# Patient Record
Sex: Female | Born: 1985
Health system: Southern US, Community
[De-identification: ages and names within clinical notes are randomized; demographics above are authoritative.]

## PROBLEM LIST (undated history)

## (undated) ENCOUNTER — Inpatient Hospital Stay (HOSPITAL_COMMUNITY): Payer: Self-pay

## (undated) HISTORY — PX: WISDOM TOOTH EXTRACTION: SHX21

---

## 2012-02-25 ENCOUNTER — Encounter (HOSPITAL_COMMUNITY): Payer: Self-pay | Admitting: Family Medicine

## 2012-02-25 ENCOUNTER — Emergency Department (HOSPITAL_COMMUNITY)
Admission: EM | Admit: 2012-02-25 | Discharge: 2012-02-25 | Disposition: A | Discharge status: Home / Self Care | Payer: Self-pay | Attending: Emergency Medicine | Admitting: Emergency Medicine

## 2012-02-25 DIAGNOSIS — N390 Urinary tract infection, site not specified: Secondary | ICD-10-CM | POA: Insufficient documentation

## 2012-02-25 DIAGNOSIS — R300 Dysuria: Secondary | ICD-10-CM | POA: Insufficient documentation

## 2012-02-25 LAB — URINALYSIS, ROUTINE W REFLEX MICROSCOPIC
Protein, ur: 100 mg/dL — AB
Urobilinogen, UA: 2 mg/dL — ABNORMAL HIGH (ref 0.0–1.0)

## 2012-02-25 LAB — POCT PREGNANCY, URINE: Preg Test, Ur: NEGATIVE

## 2012-02-25 LAB — URINE MICROSCOPIC-ADD ON

## 2012-02-25 MED ORDER — SULFAMETHOXAZOLE-TMP DS 800-160 MG PO TABS
1.0000 | ORAL_TABLET | Freq: Once | ORAL | Status: AC
  Administered 2012-02-25: 1 via ORAL
  Filled 2012-02-25: qty 1

## 2012-02-25 MED ORDER — PHENAZOPYRIDINE HCL 200 MG PO TABS: 200.0000 mg | ORAL_TABLET | Freq: Three times a day (TID) | ORAL | Status: AC | PRN

## 2012-02-25 MED ORDER — SULFAMETHOXAZOLE-TMP DS 800-160 MG PO TABS: 1.0000 | ORAL_TABLET | Freq: Two times a day (BID) | ORAL | Status: AC

## 2012-02-25 MED ORDER — PHENAZOPYRIDINE HCL 200 MG PO TABS
200.0000 mg | ORAL_TABLET | Freq: Once | ORAL | Status: AC
  Administered 2012-02-25: 200 mg via ORAL
  Filled 2012-02-25: qty 1

## 2012-02-25 NOTE — ED Notes (Signed)
Patient states that she has been having pressure in her lower abdomen, lower back pain, frequent urination, and dysuria since Sunday and has gotten. Took OTC Azo today without relief.

## 2012-02-25 NOTE — ED Notes (Signed)
ALP bedside 

## 2012-02-25 NOTE — ED Provider Notes (Signed)
History     CSN: [id number]  Arrival date & time 02/25/12  0050   First MD Initiated Contact with Patient 02/25/12 0258      Chief Complaint  Patient presents with  . Cystitis    (Consider location/radiation/quality/duration/timing/severity/associated sxs/prior treatment) HPI Comments: Patient states that for the last several, weeks.  She's had some dysuria, that she's been controlling with over-the-counter a 0 and Cystex, but for the last 2 days has had increased dysuria.  Denies fever, nausea, vomiting, diarrhea, vaginal discharge  The history is provided by the patient.    History reviewed. No pertinent past medical history.  No past surgical history on file.  No family history on file.  History  Substance Use Topics  . Smoking status: Never Smoker   . Smokeless tobacco: Not on file  . Alcohol Use: 0.6 oz/week    1 Cans of beer per week    OB History    Grav Para Term Preterm Abortions TAB SAB Ect Mult Living                  Review of Systems  Allergies  Review of patient's allergies indicates no known allergies.  Home Medications   Current Outpatient Rx  Name Route Sig Dispense Refill  . BC HEADACHE POWDER PO Oral Take 1 packet by mouth daily as needed. For pain    . CYSTEX PO Oral Take 2 capsules by mouth 3 (three) times daily.    . AZO TABS PO Oral Take 2 capsules by mouth 3 (three) times daily.      BP 133/81  Pulse 103  Temp(Src) 98.8 F (37.1 C) (Oral)  Resp 18  SpO2 100%  LMP 02/06/2012  Physical Exam  Constitutional: She appears well-developed and well-nourished.  HENT:  Head: Normocephalic.  Eyes: Pupils are equal, round, and reactive to light.  Neck: Normal range of motion.  Cardiovascular: Normal rate.   Pulmonary/Chest: Effort normal.  Abdominal: Soft. There is tenderness in the suprapubic area.      ED Course  Procedures (including critical care time)  Labs Reviewed  URINALYSIS, ROUTINE W REFLEX MICROSCOPIC - Abnormal;  Notable for the following:    Color, Urine RED (*) BIOCHEMICALS MAY BE AFFECTED BY COLOR   APPearance TURBID (*)    Hgb urine dipstick TRACE (*)    Bilirubin Urine SMALL (*)    Ketones, ur TRACE (*)    Protein, ur 100 (*)    Urobilinogen, UA 2.0 (*)    Nitrite POSITIVE (*)    Leukocytes, UA LARGE (*)    All other components within normal limits  URINE MICROSCOPIC-ADD ON - Abnormal; Notable for the following:    Bacteria, UA FEW (*)    All other components within normal limits  POCT PREGNANCY, URINE   No results found.   No diagnosis found.    MDM  Urinary tract infection        Arman Filter, NP 02/25/12 [phone]

## 2012-02-25 NOTE — Discharge Instructions (Signed)
Urinary Tract Infection Infections of the urinary tract can start in several places. A bladder infection (cystitis), a kidney infection (pyelonephritis), and a prostate infection (prostatitis) are different types of urinary tract infections (UTIs). They usually get better if treated with medicines (antibiotics) that kill germs. Take all the medicine until it is gone. You or your child may feel better in a few days, but TAKE ALL MEDICINE or the infection may not respond and may become more difficult to treat. HOME CARE INSTRUCTIONS   Drink enough water and fluids to keep the urine clear or pale yellow. Cranberry juice is especially recommended, in addition to large amounts of water.   Avoid caffeine, tea, and carbonated beverages. They tend to irritate the bladder.   Alcohol may irritate the prostate.   Only take over-the-counter or prescription medicines for pain, discomfort, or fever as directed by your caregiver.  To prevent further infections:  Empty the bladder often. Avoid holding urine for long periods of time.   After a bowel movement, women should cleanse from front to back. Use each tissue only once.   Empty the bladder before and after sexual intercourse.  FINDING OUT THE RESULTS OF YOUR TEST Not all test results are available during your visit. If your or your child's test results are not back during the visit, make an appointment with your caregiver to find out the results. Do not assume everything is normal if you have not heard from your caregiver or the medical facility. It is important for you to follow up on all test results. SEEK MEDICAL CARE IF:   There is back pain.   Your baby is older than 3 months with a rectal temperature of 100.5 F (38.1 C) or higher for more than 1 day.   Your or your child's problems (symptoms) are no better in 3 days. Return sooner if you or your child is getting worse.  SEEK IMMEDIATE MEDICAL CARE IF:   There is severe back pain or lower  abdominal pain.   You or your child develops chills.   You have a fever.   Your baby is older than 3 months with a rectal temperature of 102 F (38.9 C) or higher.   Your baby is 74 months old or younger with a rectal temperature of 100.4 F (38 C) or higher.   There is nausea or vomiting.   There is continued burning or discomfort with urination.  MAKE SURE YOU:   Understand these instructions.   Will watch your condition.   Will get help right away if you are not doing well or get worse.  Document Released: 07/15/2005 Document Revised: 09/24/2011 Document Reviewed: 02/17/2007 [facility] Patient Information 2012 [place], [place]. You have been given a prescription for a medication called, Pyridium, which will make her urine orange and coat the bladder to decrease her symptoms have also been given a prescription for Septra or Bactrim DS, which is an antibiotic, please take this until completion

## 2012-02-26 NOTE — ED Provider Notes (Signed)
Medical screening examination/treatment/procedure(s) were performed by non-physician practitioner and as supervising physician I was immediately available for consultation/collaboration.  Gerhard Munch, MD 02/26/12 [phone]

## 2013-11-30 ENCOUNTER — Encounter (HOSPITAL_COMMUNITY): Payer: Self-pay | Admitting: Emergency Medicine

## 2013-11-30 ENCOUNTER — Emergency Department (INDEPENDENT_AMBULATORY_CARE_PROVIDER_SITE_OTHER)
Admission: EM | Admit: 2013-11-30 | Discharge: 2013-11-30 | Disposition: A | Discharge status: Home / Self Care | Payer: Self-pay | Source: Home / Self Care

## 2013-11-30 DIAGNOSIS — M79609 Pain in unspecified limb: Secondary | ICD-10-CM

## 2013-11-30 DIAGNOSIS — M79661 Pain in right lower leg: Secondary | ICD-10-CM

## 2013-11-30 DIAGNOSIS — L819 Disorder of pigmentation, unspecified: Secondary | ICD-10-CM

## 2013-11-30 MED ORDER — NAPROXEN 375 MG PO TABS: 375.0000 mg | ORAL_TABLET | Freq: Two times a day (BID) | ORAL | Status: DC

## 2013-11-30 MED ORDER — DICLOFENAC SODIUM 1 % TD GEL: 1.0000 "application " | Freq: Four times a day (QID) | TRANSDERMAL | Status: DC

## 2013-11-30 NOTE — Discharge Instructions (Signed)
Pain of Unknown Etiology (Pain Without a Known Cause) °You have come to your caregiver because of pain. Pain can occur in any part of the body. Often there is not a definite cause. If your laboratory (blood or urine) work was normal and X-rays or other studies were normal, your caregiver may treat you without knowing the cause of the pain. An example of this is the headache. Most headaches are diagnosed by taking a history. This means your caregiver asks you questions about your headaches. Your caregiver determines a treatment based on your answers. Usually testing done for headaches is normal. Often testing is not done unless there is no response to medications. Regardless of where your pain is located today, you can be given medications to make you comfortable. If no physical cause of pain can be found, most cases of pain will gradually leave as suddenly as they came.  °If you have a painful condition and no reason can be found for the pain, it is important that you follow up with your caregiver. If the pain becomes worse or does not go away, it may be necessary to repeat tests and look further for a possible cause. °· Only take over-the-counter or prescription medicines for pain, discomfort, or fever as directed by your caregiver. °· For the protection of your privacy, test results cannot be given over the phone. Make sure you receive the results of your test. Ask how these results are to be obtained if you have not been informed. It is your responsibility to obtain your test results. °· You may continue all activities unless the activities cause more pain. When the pain lessens, it is important to gradually resume normal activities. Resume activities by beginning slowly and gradually increasing the intensity and duration of the activities or exercise. During periods of severe pain, bed rest may be helpful. Lie or sit in any position that is comfortable. °· Ice used for acute (sudden) conditions may be effective.  Use a large plastic bag filled with ice and wrapped in a towel. This may provide pain relief. °· See your caregiver for continued problems. Your caregiver can help or refer you for exercises or physical therapy if necessary. °If you were given medications for your condition, do not drive, operate machinery or power tools, or sign legal documents for 24 hours. Do not drink alcohol, take sleeping pills, or take other medications that may interfere with treatment. °See your caregiver immediately if you have pain that is becoming worse and not relieved by medications. °Document Released: 06/30/2001 Document Revised: 07/26/2013 Document Reviewed: 10/05/2005 °[employer]® Patient Information ©2014 [employer]. ° °Musculoskeletal Pain °Musculoskeletal pain is muscle and boney aches and pains. These pains can occur in any part of the body. Your caregiver may treat you without knowing the cause of the pain. They may treat you if blood or urine tests, X-rays, and other tests were normal.  °CAUSES °There is often not a definite cause or reason for these pains. These pains may be caused by a type of germ (virus). The discomfort may also come from overuse. Overuse includes working out too hard when your body is not fit. Boney aches also come from weather changes. Bone is sensitive to atmospheric pressure changes. °HOME CARE INSTRUCTIONS  °· Ask when your test results will be ready. Make sure you get your test results. °· Only take over-the-counter or prescription medicines for pain, discomfort, or fever as directed by your caregiver. If you were given medications for your condition,   do not drive, operate machinery or power tools, or sign legal documents for 24 hours. Do not drink alcohol. Do not take sleeping pills or other medications that may interfere with treatment. °· Continue all activities unless the activities cause more pain. When the pain lessens, slowly resume normal activities. Gradually increase the intensity and  duration of the activities or exercise. °· During periods of severe pain, bed rest may be helpful. Lay or sit in any position that is comfortable. °· Putting ice on the injured area. °· Put ice in a bag. °· Place a towel between your skin and the bag. °· Leave the ice on for 15 to 20 minutes, 3 to 4 times a day. °· Follow up with your caregiver for continued problems and no reason can be found for the pain. If the pain becomes worse or does not go away, it may be necessary to repeat tests or do additional testing. Your caregiver may need to look further for a possible cause. °SEEK IMMEDIATE MEDICAL CARE IF: °· You have pain that is getting worse and is not relieved by medications. °· You develop chest pain that is associated with shortness or breath, sweating, feeling sick to your stomach (nauseous), or throw up (vomit). °· Your pain becomes localized to the abdomen. °· You develop any new symptoms that seem different or that concern you. °MAKE SURE YOU:  °· Understand these instructions. °· Will watch your condition. °· Will get help right away if you are not doing well or get worse. °Document Released: 10/05/2005 Document Revised: 12/28/2011 Document Reviewed: 06/09/2013 °ExitCare® Patient Information ©2014 [employer]. ° °

## 2013-11-30 NOTE — ED Provider Notes (Signed)
CSN: [id number]     Arrival date & time 11/30/13  1450 History   First MD Initiated Contact with Patient 11/30/13 1531     Chief Complaint  Patient presents with  . Leg Pain     (Consider location/radiation/quality/duration/timing/severity/associated sxs/prior Treatment) HPI Comments: 28 year old female presents with complaints of markings on her right leg from her ankle toward her knee. This discoloration is located on the anterior aspect of the lower leg/shin and is often tender. Pain is intermittent and occurs with prolonged standing or after prolonged sitting. Also wearing high heels will make the pain worse. Denies any known injury, repetitive movement or other type of work that may have produced the symptoms. Denies systemic symptoms. Denies fever, fatigue, malaise or arthralgias.   History reviewed. No pertinent past medical history. History reviewed. No pertinent past surgical history. History reviewed. No pertinent family history. History  Substance Use Topics  . Smoking status: Never Smoker   . Smokeless tobacco: Not on file  . Alcohol Use: 0.6 oz/week    1 Cans of beer per week   OB History   Grav Para Term Preterm Abortions TAB SAB Ect Mult Living                 Review of Systems  Constitutional: Negative.   HENT: Negative.   Respiratory: Negative.   Cardiovascular: Negative.   Genitourinary: Negative.   Skin: Positive for color change.  Neurological: Negative.       Allergies  Review of patient's allergies indicates no known allergies.  Home Medications   Current Outpatient Rx  Name  Route  Sig  Dispense  Refill  . Aspirin-Salicylamide-Caffeine (BC HEADACHE POWDER PO)   Oral   Take 1 packet by mouth daily as needed. For pain         . diclofenac sodium (VOLTAREN) 1 % GEL   Topical   Apply 1 application topically 4 (four) times daily.   100 g   0   . Methenamine-Sodium Salicylate (CYSTEX PO)   Oral   Take 2 capsules by mouth 3 (three) times  daily.         . naproxen (NAPROSYN) 375 MG tablet   Oral   Take 1 tablet (375 mg total) by mouth 2 (two) times daily.   20 tablet   0   . Phenazopyridine HCl (AZO TABS PO)   Oral   Take 2 capsules by mouth 3 (three) times daily.          BP 137/87  Pulse 93  Temp(Src) 97.1 F (36.2 C) (Oral)  Resp 18  SpO2 99%  LMP 11/07/2013 Physical Exam  Nursing note and vitals reviewed. Constitutional: She is oriented to person, place, and time. She appears well-developed and well-nourished. No distress.  Eyes: Conjunctivae and EOM are normal.  Neck: Normal range of motion. Neck supple.  Cardiovascular: Normal rate and normal heart sounds.   Pulmonary/Chest: Effort normal. No respiratory distress.  Musculoskeletal: She exhibits no edema.  The anterior shin has a discoloration of darkened blue color in a lacy pattern. This is not a solid color. It is not palpable, does not blanch. There is tenderness. There is no tenderness of the calf, knee or ankle. Pedal pulses are 2+. Normal warmth and color of the ankles and feet. Distal neurovascular motor sensory is intact.  Neurological: She is alert and oriented to person, place, and time. She exhibits normal muscle tone.  Skin: Skin is warm and dry.  Psychiatric: She has  a normal mood and affect.    ED Course  Procedures (including critical care time) Labs Review Labs Reviewed - No data to display Imaging Review No results found.    MDM   Final diagnoses:  Discoloration of skin of lower leg  Pain of right lower leg    In etiology as to the discoloration of the right lower leg and associated discomfort. Considerations would be musculoskeletal inflammatory, vasculitis, varicosities, heme deposit among others. Treatment topical back of the neck JL and oral Naprosyn. She is to call the above number to assist in acquiring a physician for additional evaluation.    Hayden Rasmussenavid Mishka Stegemann, NP 11/30/13 1606

## 2013-11-30 NOTE — ED Provider Notes (Signed)
Medical screening examination/treatment/procedure(s) were performed by a resident physician or non-physician practitioner and as the supervising physician I was immediately available for consultation/collaboration.  Deanette Tullius, MD    Tory Septer S Clova Morlock, MD 11/30/13 2113 

## 2013-11-30 NOTE — ED Notes (Signed)
C/o off and on pain in both legs that started 3 weeks ago. Pain is 8/10. Pain starts at the bottom and radiates up her leg. Stated that if she sit for a long time and then tries to get up, it hurt really bad. Also stated that she has marks on her rt leg that she isn't sure how they came up. Written by: Marga MelnickQuaNeisha Jones, SMA

## 2013-12-14 LAB — OB RESULTS CONSOLE ANTIBODY SCREEN: Antibody Screen: NEGATIVE

## 2013-12-14 LAB — OB RESULTS CONSOLE HEPATITIS B SURFACE ANTIGEN: Hepatitis B Surface Ag: NEGATIVE

## 2013-12-14 LAB — OB RESULTS CONSOLE RUBELLA ANTIBODY, IGM: Rubella: IMMUNE

## 2013-12-14 LAB — OB RESULTS CONSOLE ABO/RH: RH Type: POSITIVE

## 2013-12-14 LAB — OB RESULTS CONSOLE GC/CHLAMYDIA
CHLAMYDIA, DNA PROBE: NEGATIVE
Gonorrhea: NEGATIVE

## 2013-12-14 LAB — OB RESULTS CONSOLE RPR: RPR: NONREACTIVE

## 2013-12-14 LAB — OB RESULTS CONSOLE HIV ANTIBODY (ROUTINE TESTING): HIV: NONREACTIVE

## 2014-10-19 NOTE — L&D Delivery Note (Signed)
Delivery Note Pt pushed for about an hour and at 2:36 PM a healthy female was delivered via Vaginal, Spontaneous Delivery (Presentation: ; Occiput Anterior).  APGAR:8 ,9 ; weight pending  .   Placenta status: Intact, Spontaneous.  Cord: 3 vessels with the following complications: None. Mild uterine atony responded well to methergine x 1 and pitocin.  Anesthesia: Epidural  Episiotomy: None Lacerations: Perineal Suture Repair: 3.0 vicryl rapide Est. Blood Loss (mL): 170cc    Mom to postpartum.  Baby to Couplet care / Skin to Skin.  Oliver PilaRICHARDSON,Emily Richards 04/20/2015, 3:18 PM

## 2014-12-14 LAB — OB RESULTS CONSOLE HIV ANTIBODY (ROUTINE TESTING): HIV: NONREACTIVE

## 2014-12-14 LAB — OB RESULTS CONSOLE ABO/RH: RH Type: POSITIVE

## 2014-12-14 LAB — OB RESULTS CONSOLE GC/CHLAMYDIA
CHLAMYDIA, DNA PROBE: NEGATIVE
GC PROBE AMP, GENITAL: NEGATIVE

## 2014-12-14 LAB — OB RESULTS CONSOLE RPR: RPR: NONREACTIVE

## 2014-12-14 LAB — OB RESULTS CONSOLE HEPATITIS B SURFACE ANTIGEN: HEP B S AG: NEGATIVE

## 2014-12-14 LAB — OB RESULTS CONSOLE RUBELLA ANTIBODY, IGM: Rubella: IMMUNE

## 2015-03-26 LAB — OB RESULTS CONSOLE GBS: STREP GROUP B AG: NEGATIVE

## 2015-04-20 ENCOUNTER — Inpatient Hospital Stay (HOSPITAL_COMMUNITY): Payer: Medicaid Other | Admitting: Anesthesiology

## 2015-04-20 ENCOUNTER — Inpatient Hospital Stay (HOSPITAL_COMMUNITY)
Admission: AD | Admit: 2015-04-20 | Discharge: 2015-04-22 | DRG: 775 | Disposition: A | Discharge status: Home / Self Care | Payer: Medicaid Other | Source: Ambulatory Visit | Attending: Obstetrics and Gynecology | Admitting: Obstetrics and Gynecology

## 2015-04-20 ENCOUNTER — Encounter (HOSPITAL_COMMUNITY): Payer: Self-pay

## 2015-04-20 DIAGNOSIS — Z3A40 40 weeks gestation of pregnancy: Secondary | ICD-10-CM | POA: Diagnosis present

## 2015-04-20 DIAGNOSIS — O9902 Anemia complicating childbirth: Secondary | ICD-10-CM | POA: Diagnosis present

## 2015-04-20 DIAGNOSIS — D573 Sickle-cell trait: Secondary | ICD-10-CM | POA: Diagnosis present

## 2015-04-20 LAB — CBC
HEMATOCRIT: 33.8 % — AB (ref 36.0–46.0)
Hemoglobin: 11.3 g/dL — ABNORMAL LOW (ref 12.0–15.0)
MCH: 25.5 pg — ABNORMAL LOW (ref 26.0–34.0)
MCHC: 33.4 g/dL (ref 30.0–36.0)
MCV: 76.1 fL — ABNORMAL LOW (ref 78.0–100.0)
PLATELETS: 199 10*3/uL (ref 150–400)
RBC: 4.44 MIL/uL (ref 3.87–5.11)
RDW: 19.3 % — AB (ref 11.5–15.5)
WBC: 7.2 10*3/uL (ref 4.0–10.5)

## 2015-04-20 LAB — TYPE AND SCREEN
ABO/RH(D): O POS
ANTIBODY SCREEN: NEGATIVE

## 2015-04-20 LAB — ABO/RH: ABO/RH(D): O POS

## 2015-04-20 MED ORDER — OXYTOCIN 40 UNITS IN LACTATED RINGERS INFUSION - SIMPLE MED
1.0000 m[IU]/min | INTRAVENOUS | Status: DC
Start: 2015-04-20 — End: 2015-04-20

## 2015-04-20 MED ORDER — IBUPROFEN 600 MG PO TABS
600.0000 mg | ORAL_TABLET | Freq: Four times a day (QID) | ORAL | Status: DC
  Administered 2015-04-20 – 2015-04-22 (×8): 600 mg via ORAL
  Filled 2015-04-20 (×8): qty 1

## 2015-04-20 MED ORDER — METHYLERGONOVINE MALEATE 0.2 MG/ML IJ SOLN
0.2000 mg | Freq: Once | INTRAMUSCULAR | Status: AC
  Administered 2015-04-20: 0.2 mg via INTRAMUSCULAR

## 2015-04-20 MED ORDER — LIDOCAINE HCL (PF) 1 % IJ SOLN
INTRAMUSCULAR | Status: DC | PRN
  Administered 2015-04-20 (×2): 4 mL

## 2015-04-20 MED ORDER — FENTANYL 2.5 MCG/ML BUPIVACAINE 1/10 % EPIDURAL INFUSION (WH - ANES)
14.0000 mL/h | INTRAMUSCULAR | Status: DC | PRN
Start: 2015-04-20 — End: 2015-04-20
  Administered 2015-04-20: 14 mL/h via EPIDURAL
  Filled 2015-04-20: qty 125

## 2015-04-20 MED ORDER — DIBUCAINE 1 % RE OINT
1.0000 "application " | TOPICAL_OINTMENT | RECTAL | Status: DC | PRN
  Administered 2015-04-21: 1 via RECTAL
  Filled 2015-04-20: qty 28

## 2015-04-20 MED ORDER — OXYCODONE-ACETAMINOPHEN 5-325 MG PO TABS
1.0000 | ORAL_TABLET | ORAL | Status: DC | PRN
  Administered 2015-04-21 – 2015-04-22 (×2): 1 via ORAL
  Filled 2015-04-20 (×2): qty 1

## 2015-04-20 MED ORDER — LIDOCAINE HCL (PF) 1 % IJ SOLN
30.0000 mL | INTRAMUSCULAR | Status: AC | PRN
  Administered 2015-04-20: 30 mL via SUBCUTANEOUS
  Filled 2015-04-20: qty 30

## 2015-04-20 MED ORDER — ACETAMINOPHEN 325 MG PO TABS: 650.0000 mg | ORAL_TABLET | ORAL | Status: DC | PRN

## 2015-04-20 MED ORDER — SIMETHICONE 80 MG PO CHEW
80.0000 mg | CHEWABLE_TABLET | ORAL | Status: DC | PRN
  Administered 2015-04-21: 80 mg via ORAL
  Filled 2015-04-20: qty 1

## 2015-04-20 MED ORDER — WITCH HAZEL-GLYCERIN EX PADS
1.0000 "application " | MEDICATED_PAD | CUTANEOUS | Status: DC | PRN
  Administered 2015-04-21: 1 via TOPICAL

## 2015-04-20 MED ORDER — DIPHENHYDRAMINE HCL 50 MG/ML IJ SOLN: 12.5000 mg | INTRAMUSCULAR | Status: DC | PRN

## 2015-04-20 MED ORDER — OXYCODONE-ACETAMINOPHEN 5-325 MG PO TABS: 1.0000 | ORAL_TABLET | ORAL | Status: DC | PRN

## 2015-04-20 MED ORDER — DIPHENHYDRAMINE HCL 25 MG PO CAPS: 25.0000 mg | ORAL_CAPSULE | Freq: Four times a day (QID) | ORAL | Status: DC | PRN

## 2015-04-20 MED ORDER — PRENATAL MULTIVITAMIN CH
1.0000 | ORAL_TABLET | Freq: Every day | ORAL | Status: DC
  Administered 2015-04-21: 1 via ORAL
  Filled 2015-04-20: qty 1

## 2015-04-20 MED ORDER — PHENYLEPHRINE 40 MCG/ML (10ML) SYRINGE FOR IV PUSH (FOR BLOOD PRESSURE SUPPORT)
80.0000 ug | PREFILLED_SYRINGE | INTRAVENOUS | Status: DC | PRN
  Filled 2015-04-20: qty 20
  Filled 2015-04-20: qty 2

## 2015-04-20 MED ORDER — LACTATED RINGERS IV SOLN
500.0000 mL | INTRAVENOUS | Status: DC | PRN
Start: 2015-04-20 — End: 2015-04-20

## 2015-04-20 MED ORDER — OXYTOCIN BOLUS FROM INFUSION
500.0000 mL | INTRAVENOUS | Status: DC
  Administered 2015-04-20: 500 mL via INTRAVENOUS

## 2015-04-20 MED ORDER — SENNOSIDES-DOCUSATE SODIUM 8.6-50 MG PO TABS
2.0000 | ORAL_TABLET | ORAL | Status: DC
Start: 2015-04-21 — End: 2015-04-22
  Administered 2015-04-20 – 2015-04-21 (×2): 2 via ORAL
  Filled 2015-04-20 (×2): qty 2

## 2015-04-20 MED ORDER — LACTATED RINGERS IV SOLN
INTRAVENOUS | Status: DC
  Administered 2015-04-20: 13:00:00 via INTRAVENOUS

## 2015-04-20 MED ORDER — ZOLPIDEM TARTRATE 5 MG PO TABS: 5.0000 mg | ORAL_TABLET | Freq: Every evening | ORAL | Status: DC | PRN

## 2015-04-20 MED ORDER — BENZOCAINE-MENTHOL 20-0.5 % EX AERO
1.0000 "application " | INHALATION_SPRAY | CUTANEOUS | Status: DC | PRN
  Administered 2015-04-20 – 2015-04-21 (×2): 1 via TOPICAL
  Filled 2015-04-20 (×2): qty 56

## 2015-04-20 MED ORDER — LANOLIN HYDROUS EX OINT
TOPICAL_OINTMENT | CUTANEOUS | Status: DC | PRN
Start: 2015-04-20 — End: 2015-04-22

## 2015-04-20 MED ORDER — EPHEDRINE 5 MG/ML INJ
10.0000 mg | INTRAVENOUS | Status: DC | PRN
  Filled 2015-04-20: qty 2

## 2015-04-20 MED ORDER — BUTORPHANOL TARTRATE 1 MG/ML IJ SOLN
1.0000 mg | INTRAMUSCULAR | Status: DC | PRN
Start: 2015-04-20 — End: 2015-04-20

## 2015-04-20 MED ORDER — ONDANSETRON HCL 4 MG/2ML IJ SOLN: 4.0000 mg | Freq: Four times a day (QID) | INTRAMUSCULAR | Status: DC | PRN

## 2015-04-20 MED ORDER — ONDANSETRON HCL 4 MG/2ML IJ SOLN: 4.0000 mg | INTRAMUSCULAR | Status: DC | PRN

## 2015-04-20 MED ORDER — OXYCODONE-ACETAMINOPHEN 5-325 MG PO TABS: 2.0000 | ORAL_TABLET | ORAL | Status: DC | PRN

## 2015-04-20 MED ORDER — ONDANSETRON HCL 4 MG PO TABS
4.0000 mg | ORAL_TABLET | ORAL | Status: DC | PRN
Start: 2015-04-20 — End: 2015-04-22

## 2015-04-20 MED ORDER — CITRIC ACID-SODIUM CITRATE 334-500 MG/5ML PO SOLN: 30.0000 mL | ORAL | Status: DC | PRN

## 2015-04-20 MED ORDER — OXYTOCIN 40 UNITS IN LACTATED RINGERS INFUSION - SIMPLE MED
62.5000 mL/h | INTRAVENOUS | Status: DC
  Filled 2015-04-20: qty 1000

## 2015-04-20 MED ORDER — TERBUTALINE SULFATE 1 MG/ML IJ SOLN
0.2500 mg | Freq: Once | INTRAMUSCULAR | Status: DC | PRN
  Filled 2015-04-20: qty 1

## 2015-04-20 MED ORDER — TETANUS-DIPHTH-ACELL PERTUSSIS 5-2.5-18.5 LF-MCG/0.5 IM SUSP: 0.5000 mL | Freq: Once | INTRAMUSCULAR | Status: DC

## 2015-04-20 NOTE — Progress Notes (Signed)
Epidural catheter removed intact 

## 2015-04-20 NOTE — Progress Notes (Signed)
Patient ID: Emily Richards, female   DOB: [birth year], 29 y.o.   MRN: [id number] Pt has continued to progress and wants an epidural  FHR Category 1 Cervix 90/6/BBOW  Will get epidural and then perform AROM.

## 2015-04-20 NOTE — Anesthesia Preprocedure Evaluation (Signed)
Anesthesia Evaluation  Patient identified by MRN, date of birth, ID band Patient awake    Reviewed: Allergy & Precautions, NPO status , Patient's Chart, lab work & pertinent test results  History of Anesthesia Complications Negative for: history of anesthetic complications  Airway Mallampati: II  TM Distance: >3 FB Neck ROM: Full    Dental no notable dental hx. (+) Dental Advisory Given   Pulmonary neg pulmonary ROS,  breath sounds clear to auscultation  Pulmonary exam normal       Cardiovascular negative cardio ROS Normal cardiovascular examRhythm:Regular Rate:Normal     Neuro/Psych negative neurological ROS  negative psych ROS   GI/Hepatic negative GI ROS, Neg liver ROS,   Endo/Other  negative endocrine ROS  Renal/GU negative Renal ROS  negative genitourinary   Musculoskeletal negative musculoskeletal ROS (+)   Abdominal   Peds negative pediatric ROS (+)  Hematology negative hematology ROS (+)   Anesthesia Other Findings   Reproductive/Obstetrics (+) Pregnancy                             Anesthesia Physical Anesthesia Plan  ASA: II  Anesthesia Plan: Epidural   Post-op Pain Management:    Induction:   Airway Management Planned:   Additional Equipment:   Intra-op Plan:   Post-operative Plan:   Informed Consent: I have reviewed the patients History and Physical, chart, labs and discussed the procedure including the risks, benefits and alternatives for the proposed anesthesia with the patient or authorized representative who has indicated his/her understanding and acceptance.   Dental advisory given  Plan Discussed with:   Anesthesia Plan Comments:         Anesthesia Quick Evaluation  

## 2015-04-20 NOTE — MAU Note (Signed)
Pt states was 3cm/80% effaced yesterday in office per Dr. Ambrose MantleHenley. No bleeding or lof.

## 2015-04-20 NOTE — H&P (Signed)
Emily Richards is a 29 y.o. female G2P1001 at 7440 1/7 weeks (EDD 04/19/15 by LMP c/w 18 week US) presenting for regular painful contractions.  Prenatal care complicated by late start at 22 weeks.  She is a sickle trait carrier.  Her last delivery she had a pp hemorrhage and had manula evacuation of clot and POC's 4 hours after delivery/vacuum.  Maternal Medical History:  Reason for admission: Contractions.   Contractions: Onset was 1-2 hours ago.   Frequency: regular.   Perceived severity is moderate.    Fetal activity: Perceived fetal activity is normal.    Prenatal Complications - Diabetes: none.    Past OB Hx Vacuum assisted 2008 5#12 oz  (pp hemorrhage)  Past medical Hx Sickle cell trait  No past surgical history on file. Family History: family history is not on file. Social History:  reports that she has never smoked. She does not have any smokeless tobacco history on file. She reports that she drinks about 0.6 oz of alcohol per week. She reports that she does not use illicit drugs.   Prenatal Transfer Tool  Maternal Diabetes: No Genetic Screening: too late to care Maternal Ultrasounds/Referrals: Normal Fetal Ultrasounds or other Referrals:  None Maternal Substance Abuse:  No Significant Maternal Medications:  None Significant Maternal Lab Results:  None Other Comments:  Sickle trait carrier  Review of Systems  Neurological: Negative for headaches.      There were no vitals taken for this visit. Maternal Exam:  Uterine Assessment: Contraction strength is moderate.  Contraction frequency is regular.   Abdomen: Patient reports no abdominal tenderness. Fetal presentation: vertex  Introitus: Normal vulva. Normal vagina.    Physical Exam  Constitutional: She appears well-developed and well-nourished.  Cardiovascular: Normal rate and regular rhythm.   Respiratory: Effort normal.  GI: Soft.  Genitourinary: Vagina normal.  gravid  Neurological: She is alert.   Psychiatric: She has a normal mood and affect.    Prenatal labs: ABO, Rh:  O positive Antibody:  negative Rubella:  Immune RPR:   Neg HBsAg:  neg  HIV:   NR GBS:   Neg One hour GTT 116  Assessment/Plan: Pt evaluated with contractions and cervical change from 3cm in office to 4.5 cm, prob early labor.  Will admit and augment as needed.  Oliver PilaRICHARDSON,[place] Mcgrory W 04/20/2015, 7:55 AM

## 2015-04-20 NOTE — Progress Notes (Signed)
Patient ID: Emily Richards, female   DOB: [birth year], 29 y.o.   MRN: [id number] Pt comfortable with epidural afeb vss FHR category 1 Cervix c/7-8/-1 AROM clear  Continue to follow progress.

## 2015-04-21 LAB — CBC
HCT: 27.8 % — ABNORMAL LOW (ref 36.0–46.0)
Hemoglobin: 9.3 g/dL — ABNORMAL LOW (ref 12.0–15.0)
MCH: 25.4 pg — AB (ref 26.0–34.0)
MCHC: 33.5 g/dL (ref 30.0–36.0)
MCV: 76 fL — ABNORMAL LOW (ref 78.0–100.0)
Platelets: 172 10*3/uL (ref 150–400)
RBC: 3.66 MIL/uL — AB (ref 3.87–5.11)
RDW: 19.2 % — ABNORMAL HIGH (ref 11.5–15.5)
WBC: 12.9 10*3/uL — ABNORMAL HIGH (ref 4.0–10.5)

## 2015-04-21 LAB — RPR: RPR: NONREACTIVE

## 2015-04-21 NOTE — Lactation Note (Signed)
This note was copied from the chart of Emily Telichia Leske. Lactation Consultation Note; Experienced BF mom. Reports baby is nursing well and feels breasts are beginning to fill. Reports she hears swallows when baby is nursing. Reports some pain with latch. Reviewed wide open mouth and keeping the baby close to the breast throughout the feeding. Comfort gels given with instructions- mom reports they feel great. BF brochure given with resources for support after DC. No questions at present. To call for assist prn  Patient Name: Emily Richards NWGNF'AToday's Date: 04/21/2015 Reason for consult: Initial assessment   Maternal Data Formula Feeding for Exclusion: Yes Reason for exclusion: Mother's choice to formula and breast feed on admission Does the patient have breastfeeding experience prior to this delivery?: Yes  Feeding Feeding Type: Breast Fed Length of feed: 10 min  LATCH Score/Interventions       Type of Nipple: Everted at rest and after stimulation  Comfort (Breast/Nipple): Filling, red/small blisters or bruises, mild/mod discomfort  Problem noted: Mild/Moderate discomfort Interventions (Mild/moderate discomfort): Comfort gels        Lactation Tools Discussed/Used Tools: Comfort gels   Consult Status Consult Status: Follow-up Date: 04/22/15 Follow-up type: In-patient    Pamelia HoitWeeks, Delwyn Scoggin D 04/21/2015, 3:12 PM

## 2015-04-21 NOTE — Anesthesia Postprocedure Evaluation (Signed)
  Anesthesia Post-op Note  Patient: Emily Richards  Procedure(s) Performed: * No procedures listed *  Patient Location: Mother/Baby  Anesthesia Type:Epidural  Level of Consciousness: awake and alert   Airway and Oxygen Therapy: Patient Spontanous Breathing  Post-op Pain: mild  Post-op Assessment: Post-op Vital signs reviewed, Patient's Cardiovascular Status Stable, Respiratory Function Stable, No signs of Nausea or vomiting, Pain level controlled, No headache, Spinal receding and Patient able to bend at knees              Post-op Vital Signs: Reviewed  Last Vitals:  Filed Vitals:   04/21/15 0608  BP: 120/75  Pulse: 87  Temp: 36.7 C  Resp: 18    Complications: No apparent anesthesia complications

## 2015-04-21 NOTE — Progress Notes (Signed)
Post Partum Day 1 Subjective: no complaints and tolerating PO  Objective: Blood pressure 120/75, pulse 87, temperature 98 F (36.7 C), temperature source Oral, resp. rate 18, height 5\' 2"  (1.575 m), weight 60.555 kg (133 lb 8 oz), SpO2 100 %, unknown if currently breastfeeding.  Physical Exam:  General: alert and cooperative Lochia: appropriate Uterine Fundus: firm    Recent Labs  04/20/15 0900 04/21/15 0548  HGB 11.3* 9.3*  HCT 33.8* 27.8*    Assessment/Plan: Plan for discharge tomorrow   LOS: 1 day   Deboraha Goar W 04/21/2015, 10:11 AM

## 2015-04-22 MED ORDER — IBUPROFEN 800 MG PO TABS: 800.0000 mg | ORAL_TABLET | Freq: Three times a day (TID) | ORAL | Status: DC | PRN

## 2015-04-22 MED ORDER — PRENATAL MULTIVITAMIN CH: 1.0000 | ORAL_TABLET | Freq: Every day | ORAL | Status: DC

## 2015-04-22 MED ORDER — OXYCODONE-ACETAMINOPHEN 5-325 MG PO TABS: 1.0000 | ORAL_TABLET | Freq: Four times a day (QID) | ORAL | Status: DC | PRN

## 2015-04-22 NOTE — Progress Notes (Signed)
Post Partum Day 2 Subjective: no complaints, up ad lib, voiding, tolerating PO and nl lochia, pain controlled  Objective: Blood pressure 106/83, pulse 100, temperature 97.9 F (36.6 C), temperature source Oral, resp. rate 20, height 5\' 2"  (1.575 m), weight 60.555 kg (133 lb 8 oz), SpO2 99 %, unknown if currently breastfeeding.  Physical Exam:  General: alert and no distress Lochia: appropriate Uterine Fundus: firm  Recent Labs  04/20/15 0900 04/21/15 0548  HGB 11.3* 9.3*  HCT 33.8* 27.8*    Assessment/Plan: Discharge home.  Routine care.  D/C with Motrin, percocet and PNV, f/u 6 weeks   LOS: 2 days   Bovard-Stuckert, Emily Richards 04/22/2015, 8:03 AM

## 2015-04-22 NOTE — Discharge Summary (Signed)
Obstetric Discharge Summary Reason for Admission: onset of labor Prenatal Procedures: none Intrapartum Procedures: spontaneous vaginal delivery Postpartum Procedures: uterine atony - Methergine Complications-Operative and Postpartum: perineal laceration HEMOGLOBIN  Date Value Ref Range Status  04/21/2015 9.3* 12.0 - 15.0 g/dL Final   HCT  Date Value Ref Range Status  04/21/2015 27.8* 36.0 - 46.0 % Final    Physical Exam:  General: alert and no distress Lochia: appropriate Uterine Fundus: firm  Discharge Diagnoses: Term Pregnancy-delivered  Discharge Information: Date: 04/22/2015 Activity: pelvic rest Diet: routine Medications: PNV, Ibuprofen and Percocet Condition: stable Instructions: refer to practice specific booklet Discharge to: home Follow-up Information    Follow up with Oliver PilaICHARDSON,KATHY W, MD In 6 weeks.   Specialty:  Obstetrics and Gynecology   Why:  postpartum   Contact information:   [address] [place] [address] [phone]       Newborn Data: Live born female  Birth Weight: 8 lb 8.7 oz (3875 g) APGAR: 8, 9  Home with mother.  Bovard-Stuckert, Zamaya Rapaport 04/22/2015, 8:43 AM

## 2015-04-22 NOTE — Lactation Note (Signed)
This note was copied from the chart of Emily Telichia Jolliffe. Lactation Consultation Note: mother assisted to chair for proper positioning. Infant placed in football hold. Mother taught good breast support and assymetrical latch technique. Infant sustained latch for 15 mins and another 20 on alternate breast. Mother taught breast compression and advised in feeding infant 8-12 times in 24 hours. Mother is aware of available LC services as needed.  Patient Name: Emily Richards VZDGL'OToday's Date: 04/22/2015 Reason for consult: Follow-up assessment   Maternal Data    Feeding Feeding Type: Breast Fed Length of feed: 20 min  LATCH Score/Interventions Latch: Grasps breast easily, tongue down, lips flanged, rhythmical sucking. Intervention(s): Adjust position;Assist with latch;Breast compression  Audible Swallowing: Spontaneous and intermittent  Type of Nipple: Everted at rest and after stimulation  Comfort (Breast/Nipple): Filling, red/small blisters or bruises, mild/mod discomfort     Hold (Positioning): Assistance needed to correctly position infant at breast and maintain latch. Intervention(s): Position options  LATCH Score: 8  Lactation Tools Discussed/Used     Consult Status Consult Status: Complete    Michel BickersKendrick, Linetta Regner McCoy 04/22/2015, 12:15 PM

## 2015-04-22 NOTE — Progress Notes (Signed)
UR chart review completed.  

## 2016-04-22 ENCOUNTER — Encounter (HOSPITAL_COMMUNITY): Payer: Self-pay | Admitting: Emergency Medicine

## 2016-04-22 ENCOUNTER — Ambulatory Visit (HOSPITAL_COMMUNITY)
Admission: EM | Admit: 2016-04-22 | Discharge: 2016-04-22 | Disposition: A | Discharge status: Home / Self Care | Payer: 59 | Attending: Emergency Medicine | Admitting: Emergency Medicine

## 2016-04-22 DIAGNOSIS — K120 Recurrent oral aphthae: Secondary | ICD-10-CM

## 2016-04-22 MED ORDER — LIDOCAINE VISCOUS 2 % MT SOLN: 10.0000 mL | OROMUCOSAL | Status: DC | PRN

## 2016-04-22 NOTE — ED Notes (Signed)
[facility] a [facility] of a sore mouth to include the roof of her mouth and tongue. She also reported some lesions in her mouth as well. She stated that this started about 1 week ago.

## 2016-04-22 NOTE — ED Provider Notes (Signed)
CSN: [id number]     Arrival date & time 04/22/16  0957 History   First MD Initiated Contact with Patient 04/22/16 1035     Chief Complaint  Patient presents with  . Mouth Lesions   (Consider location/radiation/quality/duration/timing/severity/associated sxs/prior Treatment) HPI History obtained from patient: Location:  Mouth Context/Duration: Sudden onset several days ago of sores in her mouth   Severity: 5 or 6  Quality: Throbbing Timing:         Constant   Home Treatment: None Associated symptoms:  Difficulty swallowing, sore throat  Family History: None     Past Medical History  Diagnosis Date  . Postpartum hemorrhage    Past Surgical History  Procedure Laterality Date  . Wisdom tooth extraction     History reviewed. No pertinent family history. Social History  Substance Use Topics  . Smoking status: Never Smoker   . Smokeless tobacco: Never Used  . Alcohol Use: No   OB History    Gravida Para Term Preterm AB TAB SAB Ectopic Multiple Living   2 2 2       0 2     Review of Systems  Denies: HEADACHE, NAUSEA, ABDOMINAL PAIN, CHEST PAIN, CONGESTION, DYSURIA, SHORTNESS OF BREATH  Allergies  Review of patient's allergies indicates no known allergies.  Home Medications   Prior to Admission medications   Medication Sig Start Date End Date Taking? Authorizing Provider  ibuprofen (ADVIL,MOTRIN) 800 MG tablet Take 1 tablet (800 mg total) by mouth every 8 (eight) hours as needed. 04/22/15   Jody Bovard-Stuckert, MD  lidocaine (XYLOCAINE) 2 % solution Use as directed 10 mLs in the mouth or throat as needed for mouth pain. 04/22/16   Tharon AquasFrank C Bevin Das, PA  oxyCODONE-acetaminophen (PERCOCET/ROXICET) 5-325 MG per tablet Take 1-2 tablets by mouth every 6 (six) hours as needed for severe pain. 04/22/15   Sherian ReinJody Bovard-Stuckert, MD  Prenatal Vit-Fe Fumarate-FA (PRENATAL MULTIVITAMIN) TABS tablet Take 1 tablet by mouth daily at 12 noon. 04/22/15   Sherian ReinJody Bovard-Stuckert, MD   Meds Ordered and  Administered this Visit  Medications - No data to display  BP 138/86 mmHg  Pulse 92  Temp(Src) 97.3 F (36.3 C) (Oral)  Resp 16  SpO2 97%  LMP 04/07/2016 (Exact Date)  Breastfeeding? No No data found.   Physical Exam NURSES NOTES AND VITAL SIGNS REVIEWED. CONSTITUTIONAL: Well developed, well nourished, no acute distress HEENT: normocephalic, atraumatic, Multiple small ulcerations under tongue and buccal mucosa soft palate. These are single well-defined lesions with a red base. Not vesicular EYES: Conjunctiva normal NECK:normal ROM, supple, no adenopathy PULMONARY:No respiratory distress, normal effort ABDOMINAL: Soft, ND, NT BS+, No CVAT MUSCULOSKELETAL: Normal ROM of all extremities,  SKIN: warm and dry without rash PSYCHIATRIC: Mood and affect, behavior are normal  ED Course  Procedures (including critical care time)  Labs Review Labs Reviewed - No data to display  Imaging Review No results found.   Visual Acuity Review  Right Eye Distance:   Left Eye Distance:   Bilateral Distance:    Right Eye Near:   Left Eye Near:    Bilateral Near:      rx VISCOUS LIDOCAINE   MDM   1. Canker sore     Patient is reassured that there are no issues that require transfer to higher level of care at this time or additional tests. Patient is advised to continue home symptomatic treatment. Patient is advised that if there are new or worsening symptoms to attend the emergency department, contact  primary care provider, or return to UC. Instructions of care provided discharged home in stable condition.    THIS NOTE WAS GENERATED USING A VOICE RECOGNITION SOFTWARE PROGRAM. ALL REASONABLE EFFORTS  WERE MADE TO PROOFREAD THIS DOCUMENT FOR ACCURACY.  I have verbally reviewed the discharge instructions with the patient. A printed AVS was given to the patient.  All questions were answered prior to discharge.      Tharon AquasFrank C Jorgia Manthei, PA 04/22/16 1444

## 2016-04-22 NOTE — Discharge Instructions (Signed)
Canker Sores Canker sores are small, painful sores that develop inside your mouth. They may also be called aphthous ulcers. You can get canker sores on the inside of your lips or cheeks, on your tongue, or anywhere inside your mouth. You can have just one canker sore or several of them. Canker sores cannot be passed from one person to another (noncontagious). These sores are different than the sores that you may get on the outside of your lips (cold sores or fever blisters). Canker sores usually start as painful red bumps. Then they turn into small white, yellow, or gray ulcers that have red borders. The ulcers may be quite painful. The pain may be worse when you eat or drink. CAUSES The cause of this condition is not known. RISK FACTORS This condition is more likely to develop in:  Women.  People in their teens or 6620s.  Women who are having their menstrual period.  People who are under a lot of emotional stress.  People who do not get enough iron or B vitamins.  People who have poor oral hygiene.  People who have an injury inside the mouth. This can happen after having dental work or from chewing something hard. SYMPTOMS Along with the canker sore, symptoms may also include:  Fever.  Fatigue.  Swollen lymph nodes in your neck. DIAGNOSIS This condition can be diagnosed based on your symptoms. Your health care provider will also examine your mouth. Your health care provider may also do tests if you get canker sores often or if they are very bad. Tests may include:  Blood tests to rule out other causes of canker sores.  Taking swabs from the sore to check for infection.  Taking a small piece of skin from the sore (biopsy) to test it for cancer. TREATMENT Most canker sores clear up without treatment in about 10 days. Home care is usually the only treatment that you will need. Over-the-counter medicines can relieve discomfort.If you have severe canker sores, your health care  provider may prescribe:  Numbing ointment to relieve pain.  Vitamins.  Steroid medicines. These may be given as:  Oral pills.  Mouth rinses.  Gels.  Antibiotic mouth rinse. HOME CARE INSTRUCTIONS  Apply, take, or use medicines only as directed by your health care provider. These include vitamins.  If you were prescribed an antibiotic mouth rinse, finish all of it even if you start to feel better.  Until the sores are healed:  Do not drink coffee or citrus juices.  Do not eat spicy or salty foods.  Use a mild, over-the-counter mouth rinse as directed by your health care provider.  Practice good oral hygiene.  Floss your teeth every day.  Brush your teeth with a soft brush twice each day. SEEK MEDICAL CARE IF:  Your symptoms do not get better after two weeks.  You also have a fever or swollen glands.  You get canker sores often.  You have a canker sore that is getting larger.  You cannot eat or drink due to your canker sores.   This information is not intended to replace advice given to you by your health care provider. Make sure you discuss any questions you have with your health care provider.   Document Released: 01/30/2011 Document Revised: 02/19/2015 Document Reviewed: 09/05/2014 Elsevier Interactive Patient Education 2016 Elsevier Inc.  Oral Ulcers Oral ulcers are painful, shallow sores around the lining of the mouth. They can affect the gums, the inside of the lips, and the  cheeks. (Sores on the outside of the lips and on the face are different.) They typically first occur in school-aged children and teenagers. Oral ulcers may also be called canker sores or cold sores. CAUSES  Canker sores and cold sores can be caused by many factors including:  Infection.  Injury.  Sun exposure.  Medications.  Emotional stress.  Food allergies.  Vitamin deficiencies.  Toothpastes containing sodium lauryl sulfate. The herpes virus can be the cause of mouth  ulcers. The first infection can be severe and cause 10 or more ulcers on the gums, tongue, and lips with fever and difficulty in swallowing. This infection usually occurs between the ages of 1 and 3 years.  SYMPTOMS  The typical sore is about  inch (6 mm) in size and is an oval or round ulcer with red borders. DIAGNOSIS  Your caregiver can diagnose simple oral ulcers by examination. Additional testing is usually not required.  TREATMENT  Treatment is aimed at pain relief. Generally, oral ulcers resolve by themselves within 1 to 2 weeks without medication and are not contagious unless caused by herpes (and other viruses). Antibiotics are not effective with mouth sores. Avoid direct contact with others until the ulcer is completely healed. See your caregiver for follow-up care as recommended. Also:  Offer a soft diet.  Encourage plenty of fluids to prevent dehydration. Popsicles and milk shakes can be helpful.  Avoid acidic and salty foods and drinks such as orange juice.  Infants and young children will often refuse to drink because of pain. Using a teaspoon, cup, or syringe to give small amounts of fluids frequently can help prevent dehydration.  Cold compresses on the face may help reduce pain.  Pain medication can help control soreness.  A solution of diphenhydramine mixed with a liquid antacid can be useful to decrease the soreness of ulcers. Consult a caregiver for the dosing.  Liquids or ointments with a numbing ingredient may be helpful when used as recommended.  Older children and teenagers can rinse their mouth with a salt-water mixture (1/2 teaspoon of salt in 8 ounces of water) four times a day. This treatment is uncomfortable but may reduce the time the ulcers are present.  There are many over-the-counter throat lozenges and medications available for oral ulcers. Their effectiveness has not been studied.  Consult your medical caregiver prior to using homeopathic treatments for  oral ulcers. SEEK MEDICAL CARE IF:   You think your child needs to be seen.  The pain worsens and you cannot control it.  There are 4 or more ulcers.  The lips and gums begin to bleed and crust.  A single mouth ulcer is near a tooth that is causing a toothache or pain.  Your child has a fever, swollen face, or swollen glands.  The ulcers began after starting a medication.  Mouth ulcers keep reoccurring or last more than 2 weeks.  You think your child is not taking adequate fluids. SEEK IMMEDIATE MEDICAL CARE IF:   Your child has a high fever.  Your child is unable to swallow or becomes dehydrated.  Your child looks or acts very ill.  An ulcer caused by a chemical your child accidentally put in their mouth.   This information is not intended to replace advice given to you by your health care provider. Make sure you discuss any questions you have with your health care provider.   Document Released: 11/12/2004 Document Revised: 10/26/2014 Document Reviewed: 02/20/2015 Elsevier Interactive Patient Education 2016 Elsevier  Inc. ° °

## 2016-11-21 DIAGNOSIS — H04123 Dry eye syndrome of bilateral lacrimal glands: Secondary | ICD-10-CM | POA: Diagnosis not present

## 2016-11-21 DIAGNOSIS — H40033 Anatomical narrow angle, bilateral: Secondary | ICD-10-CM | POA: Diagnosis not present

## 2017-06-18 DIAGNOSIS — R102 Pelvic and perineal pain: Secondary | ICD-10-CM | POA: Diagnosis not present

## 2018-02-22 DIAGNOSIS — Z6821 Body mass index (BMI) 21.0-21.9, adult: Secondary | ICD-10-CM | POA: Diagnosis not present

## 2018-02-22 DIAGNOSIS — Z124 Encounter for screening for malignant neoplasm of cervix: Secondary | ICD-10-CM | POA: Diagnosis not present

## 2018-02-22 DIAGNOSIS — Z130 Encounter for screening for diseases of the blood and blood-forming organs and certain disorders involving the immune mechanism: Secondary | ICD-10-CM | POA: Diagnosis not present

## 2018-02-22 DIAGNOSIS — Z01419 Encounter for gynecological examination (general) (routine) without abnormal findings: Secondary | ICD-10-CM | POA: Diagnosis not present

## 2018-04-19 DIAGNOSIS — L700 Acne vulgaris: Secondary | ICD-10-CM | POA: Diagnosis not present

## 2018-06-14 DIAGNOSIS — Z3201 Encounter for pregnancy test, result positive: Secondary | ICD-10-CM | POA: Diagnosis not present

## 2018-07-01 DIAGNOSIS — Z3201 Encounter for pregnancy test, result positive: Secondary | ICD-10-CM | POA: Diagnosis not present

## 2018-07-01 DIAGNOSIS — N912 Amenorrhea, unspecified: Secondary | ICD-10-CM | POA: Diagnosis not present

## 2018-07-29 DIAGNOSIS — Z23 Encounter for immunization: Secondary | ICD-10-CM | POA: Diagnosis not present

## 2018-07-29 DIAGNOSIS — Z3A11 11 weeks gestation of pregnancy: Secondary | ICD-10-CM | POA: Diagnosis not present

## 2018-07-29 DIAGNOSIS — O30041 Twin pregnancy, dichorionic/diamniotic, first trimester: Secondary | ICD-10-CM | POA: Diagnosis not present

## 2018-07-29 LAB — OB RESULTS CONSOLE RUBELLA ANTIBODY, IGM: Rubella: IMMUNE

## 2018-07-29 LAB — OB RESULTS CONSOLE HEPATITIS B SURFACE ANTIGEN: Hepatitis B Surface Ag: NEGATIVE

## 2018-07-29 LAB — OB RESULTS CONSOLE ABO/RH: RH TYPE: POSITIVE

## 2018-07-29 LAB — OB RESULTS CONSOLE GC/CHLAMYDIA
Chlamydia: NEGATIVE
Gonorrhea: NEGATIVE

## 2018-07-29 LAB — OB RESULTS CONSOLE RPR: RPR: NONREACTIVE

## 2018-07-29 LAB — OB RESULTS CONSOLE ANTIBODY SCREEN: ANTIBODY SCREEN: NEGATIVE

## 2018-07-29 LAB — OB RESULTS CONSOLE HIV ANTIBODY (ROUTINE TESTING): HIV: NONREACTIVE

## 2018-08-26 DIAGNOSIS — Z3A15 15 weeks gestation of pregnancy: Secondary | ICD-10-CM | POA: Diagnosis not present

## 2018-08-26 DIAGNOSIS — O30042 Twin pregnancy, dichorionic/diamniotic, second trimester: Secondary | ICD-10-CM | POA: Diagnosis not present

## 2018-09-20 ENCOUNTER — Encounter (HOSPITAL_COMMUNITY): Payer: Self-pay | Admitting: *Deleted

## 2018-09-20 ENCOUNTER — Telehealth (HOSPITAL_COMMUNITY): Payer: Self-pay | Admitting: *Deleted

## 2018-09-20 DIAGNOSIS — Z3A19 19 weeks gestation of pregnancy: Secondary | ICD-10-CM | POA: Diagnosis not present

## 2018-09-20 DIAGNOSIS — O30042 Twin pregnancy, dichorionic/diamniotic, second trimester: Secondary | ICD-10-CM | POA: Diagnosis not present

## 2018-09-20 NOTE — Telephone Encounter (Signed)
Preadmission screen  

## 2018-09-20 NOTE — H&P (Signed)
Emily Richards is a 32 y.o. female  G3P2002 at 1119 5/7 di/di twin gestation (EDD 02/11/19 by 12 week US inconsistent with LMP)  presenting for cervical cerclage placement after cervix noted to be 1.2cm with funneling on her anatomy US.  She was started on vaginal progesterone and offered cerclage. Prenatal care significant for the twin gestation and for patient being a sickle trait carrier.  The twins are confirmed low risk di/di twins on NIPS.    OB History    Gravida  3   Para  2   Term  2   Preterm      AB      Living  2     SAB      TAB      Ectopic      Multiple  0   Live Births  2         2008 vacuum NSVD 38 weeks 5#12oz 2016 NSVD 8#8oz 40 weeks Past Medical History:  Diagnosis Date  . Postpartum hemorrhage    Past Surgical History:  Procedure Laterality Date  . WISDOM TOOTH EXTRACTION     Family History: family history is not on file. Social History:  reports that she has never smoked. She has never used smokeless tobacco. She reports that she does not drink alcohol or use drugs.     Maternal Diabetes: not yet tessted Genetic Screening: Normal Maternal Ultrasounds/Referrals: Normal except shortened cervix with funneling Fetal Ultrasounds or other Referrals:  None Maternal Substance Abuse:  No Significant Maternal Medications:  Meds include: Progesterone Significant Maternal Lab Results:  None Other Comments:  None  Review of Systems  Gastrointestinal: Negative for abdominal pain.  Musculoskeletal: Negative for back pain.   Maternal Medical History:  Fetal activity: Perceived fetal activity is normal.        Last menstrual period 05/14/2018. Maternal Exam:  Uterine Assessment: Contraction strength is mild.  Contraction frequency is rare.   Abdomen: Patient reports no abdominal tenderness. Introitus: Normal vulva. Normal vagina.    Physical Exam  Constitutional: She appears well-developed and well-nourished.  Cardiovascular: Normal rate  and regular rhythm.  Respiratory: Effort normal.  GI: Soft.  Genitourinary: Vagina normal.  Genitourinary Comments: Fundal height 24 cm  Neurological: She is alert.  Psychiatric: She has a normal mood and affect.    Prenatal labs: ABO, Rh: O/Positive/-- (10/11 0000) Antibody: n (10/11 0000) Rubella: Immune (10/11 0000) RPR: Nonreactive (10/11 0000)  HBsAg: Negative (10/11 0000)  HIV: Non-reactive (10/11 0000)  GBS:   unknown  Assessment/Plan:  d/w pt cervical cerclage and/or vaginal progesterone. We discussed the risks of cerclage with possible ROM and loss of pregnancy. She is agreeable to beginning progesterone and having a cerclage placed   Oliver PilaKathy W Annayah Worthley 09/20/2018, 11:17 PM

## 2018-09-21 ENCOUNTER — Encounter (HOSPITAL_COMMUNITY): Payer: Self-pay

## 2018-09-21 ENCOUNTER — Encounter (HOSPITAL_COMMUNITY)
Admission: RE | Admit: 2018-09-21 | Discharge: 2018-09-21 | Disposition: A | Discharge status: Home / Self Care | Payer: 59 | Source: Ambulatory Visit

## 2018-09-22 ENCOUNTER — Encounter (HOSPITAL_COMMUNITY)
Admission: RE | Disposition: A | Discharge status: Home / Self Care | Payer: Self-pay | Source: Ambulatory Visit | Attending: Obstetrics and Gynecology

## 2018-09-22 ENCOUNTER — Ambulatory Visit (HOSPITAL_COMMUNITY): Payer: 59 | Admitting: Anesthesiology

## 2018-09-22 ENCOUNTER — Ambulatory Visit (HOSPITAL_COMMUNITY)
Admission: RE | Admit: 2018-09-22 | Discharge: 2018-09-22 | Disposition: A | Discharge status: Home / Self Care | Payer: 59 | Source: Ambulatory Visit | Attending: Obstetrics and Gynecology | Admitting: Obstetrics and Gynecology

## 2018-09-22 ENCOUNTER — Encounter (HOSPITAL_COMMUNITY): Payer: Self-pay | Admitting: *Deleted

## 2018-09-22 DIAGNOSIS — O24912 Unspecified diabetes mellitus in pregnancy, second trimester: Secondary | ICD-10-CM | POA: Insufficient documentation

## 2018-09-22 DIAGNOSIS — O30042 Twin pregnancy, dichorionic/diamniotic, second trimester: Secondary | ICD-10-CM | POA: Diagnosis not present

## 2018-09-22 DIAGNOSIS — Z3A19 19 weeks gestation of pregnancy: Secondary | ICD-10-CM | POA: Diagnosis not present

## 2018-09-22 DIAGNOSIS — O26879 Cervical shortening, unspecified trimester: Secondary | ICD-10-CM | POA: Diagnosis not present

## 2018-09-22 DIAGNOSIS — O3432 Maternal care for cervical incompetence, second trimester: Secondary | ICD-10-CM | POA: Insufficient documentation

## 2018-09-22 DIAGNOSIS — O26872 Cervical shortening, second trimester: Secondary | ICD-10-CM | POA: Diagnosis not present

## 2018-09-22 HISTORY — PX: CERVICAL CERCLAGE: SHX1329

## 2018-09-22 LAB — CBC
HCT: 38.2 % (ref 36.0–46.0)
Hemoglobin: 13.1 g/dL (ref 12.0–15.0)
MCH: 30.8 pg (ref 26.0–34.0)
MCHC: 34.3 g/dL (ref 30.0–36.0)
MCV: 89.7 fL (ref 80.0–100.0)
Platelets: 249 10*3/uL (ref 150–400)
RBC: 4.26 MIL/uL (ref 3.87–5.11)
RDW: 14.3 % (ref 11.5–15.5)
WBC: 7.7 10*3/uL (ref 4.0–10.5)
nRBC: 0 % (ref 0.0–0.2)

## 2018-09-22 SURGERY — CERCLAGE, CERVIX, VAGINAL APPROACH
Anesthesia: Spinal

## 2018-09-22 MED ORDER — LACTATED RINGERS IV SOLN
INTRAVENOUS | Status: DC
  Administered 2018-09-22: 09:00:00 via INTRAVENOUS

## 2018-09-22 MED ORDER — OXYCODONE HCL 5 MG/5ML PO SOLN: 5.0000 mg | Freq: Once | ORAL | Status: DC | PRN

## 2018-09-22 MED ORDER — ACETAMINOPHEN 325 MG PO TABS
650.0000 mg | ORAL_TABLET | Freq: Four times a day (QID) | ORAL | Status: DC | PRN
  Administered 2018-09-22: 650 mg via ORAL
  Filled 2018-09-22: qty 2

## 2018-09-22 MED ORDER — PROMETHAZINE HCL 25 MG/ML IJ SOLN: 6.2500 mg | INTRAMUSCULAR | Status: DC | PRN

## 2018-09-22 MED ORDER — SODIUM CHLORIDE 0.9 % IV SOLN
3.0000 g | INTRAVENOUS | Status: AC
  Administered 2018-09-22: 3 g via INTRAVENOUS
  Filled 2018-09-22: qty 3

## 2018-09-22 MED ORDER — HYDROMORPHONE HCL 1 MG/ML IJ SOLN: 0.2500 mg | INTRAMUSCULAR | Status: DC | PRN

## 2018-09-22 MED ORDER — BUPIVACAINE IN DEXTROSE 0.75-8.25 % IT SOLN
INTRATHECAL | Status: DC | PRN
  Administered 2018-09-22: 1.6 mL via INTRATHECAL

## 2018-09-22 MED ORDER — OXYCODONE HCL 5 MG PO TABS: 5.0000 mg | ORAL_TABLET | Freq: Once | ORAL | Status: DC | PRN

## 2018-09-22 SURGICAL SUPPLY — 15 items
GLOVE BIO SURGEON STRL SZ 6.5 (GLOVE) ×4 IMPLANT
GLOVE BIO SURGEONS STRL SZ 6.5 (GLOVE) ×2
GOWN STRL REUS W/TWL LRG LVL3 (GOWN DISPOSABLE) ×6 IMPLANT
NEEDLE MAYO CATGUT SZ4 (NEEDLE) ×3 IMPLANT
NS IRRIG 1000ML POUR BTL (IV SOLUTION) ×3 IMPLANT
PACK VAGINAL MINOR WOMEN LF (CUSTOM PROCEDURE TRAY) ×3 IMPLANT
PAD OB MATERNITY 4.3X12.25 (PERSONAL CARE ITEMS) ×3 IMPLANT
PAD PREP 24X48 CUFFED NSTRL (MISCELLANEOUS) ×3 IMPLANT
SUT POLYDEK 5 CE 75 36 (SUTURE) ×3 IMPLANT
SYR BULB IRRIGATION 50ML (SYRINGE) ×3 IMPLANT
TOWEL OR 17X24 6PK STRL BLUE (TOWEL DISPOSABLE) ×6 IMPLANT
TRAY FOLEY W/BAG SLVR 14FR (SET/KITS/TRAYS/PACK) ×3 IMPLANT
TUBING NON-CON 1/4 X 20 CONN (TUBING) ×2 IMPLANT
TUBING NON-CON 1/4 X 20' CONN (TUBING) ×1
YANKAUER SUCT BULB TIP NO VENT (SUCTIONS) ×3 IMPLANT

## 2018-09-22 NOTE — Progress Notes (Signed)
Patient ID: Emily Richards, female   DOB: 11/30/1985, 32 y.o.   MRN: [id number] Pt doing well.  No bleeding, cramping or LOF.  Questions answered for her and husband.  We reviewed risks of ROM and pregnancy loss again.  They are ready to proceed.

## 2018-09-22 NOTE — Op Note (Signed)
Operative Note    Preoperative Diagnosis Preterm di/di twin pregnancy at 19 5/7 weeks Cervical shortening to 1.2cm and funneling  Postoperative Diagnosis same  Procedure McDonald Cerclage x 1 with know at 12 o'clock  Surgeon Huel CoteKathy Finnegan Gatta, MD Janalyn ShyHeather Kreitemeyer, RNFA  Anesthesia Spinal  Fluids: EBL 50mL UOP foley placed after procedure IVF 1200mL  Findings Cervix with about 1.5 cm in length, no membranes exposed but os appeared possibly FT dilated  Specimen None  Procedure Note The patient was taken to the operating room where spinal anesthesia was obtained without difficulty.  She was then prepped and draped in a normal sterile fashion in the dorsal lithotomy position.  An appropriate timeout was performed.  A weighted speculum was placed in the vagina as well as two deaver retractors to expose the cervix.  A pursestring suture of 0 polydek was then placed circumferentially around the cervix. The knot was tied at 12 o'clock.  At the conclusion of the procedure the vagina was irrigated and no active bleeding noted.  The patient was taken to the recovery room in good condition and FHT's will obtained prior to d/c.

## 2018-09-22 NOTE — Transfer of Care (Signed)
Immediate Anesthesia Transfer of Care Note  Patient: Emily Richards  Procedure(s) Performed: CERCLAGE CERVICAL (N/A )  Patient Location: birthing suites recovery  Anesthesia Type:Spinal  Level of Consciousness: awake, alert , oriented and patient cooperative  Airway & Oxygen Therapy: Patient Spontanous Breathing  Post-op Assessment: Report given to RN and Post -op Vital signs reviewed and stable  Post vital signs: Reviewed and stable  Last Vitals:  Vitals Value Taken Time  BP    Temp    Pulse    Resp    SpO2      Last Pain:  Vitals:   09/22/18 0753  TempSrc: Oral  PainSc: 0-No pain         Complications: No apparent anesthesia complications

## 2018-09-22 NOTE — Discharge Planning (Addendum)
Instructed pt on voiding by 1930 if unable to contact provider. Pt verbalized understanding of discharge instructions.discharged with husband to home. Pt stable ambulated around room

## 2018-09-22 NOTE — Discharge Instructions (Signed)

## 2018-09-22 NOTE — Anesthesia Procedure Notes (Signed)
Spinal  Patient location during procedure: OR Start time: 09/22/2018 8:56 AM End time: 09/22/2018 9:01 AM Staffing Anesthesiologist: Lowella CurbMiller, Eudell Mcphee Ray, MD Performed: anesthesiologist  Preanesthetic Checklist Completed: patient identified, surgical consent, pre-op evaluation, timeout performed, IV checked, risks and benefits discussed and monitors and equipment checked Spinal Block Patient position: sitting Prep: site prepped and draped and DuraPrep Patient monitoring: heart rate, cardiac monitor, continuous pulse ox and blood pressure Approach: midline Location: L3-4 Injection technique: single-shot Needle Needle type: Pencan  Needle gauge: 24 G Needle length: 10 cm Assessment Sensory level: T4

## 2018-09-22 NOTE — Anesthesia Postprocedure Evaluation (Signed)
Anesthesia Post Note  Patient: Emily Richards  Procedure(s) Performed: CERCLAGE CERVICAL (N/A )     Patient location during evaluation: PACU Anesthesia Type: Spinal Level of consciousness: oriented and awake and alert Pain management: pain level controlled Vital Signs Assessment: post-procedure vital signs reviewed and stable Respiratory status: spontaneous breathing and respiratory function stable Cardiovascular status: blood pressure returned to baseline and stable Postop Assessment: no headache, no backache and no apparent nausea or vomiting Anesthetic complications: no    Last Vitals:  Vitals:   09/22/18 1045 09/22/18 1100  BP: 110/66 108/67  Pulse: 82 91  Resp:    Temp:    SpO2: 100% 100%    Last Pain:  Vitals:   09/22/18 1115  TempSrc:   PainSc: 0-No pain   Pain Goal:                 Lowella CurbWarren Ray Vicente Weidler

## 2018-09-22 NOTE — Anesthesia Preprocedure Evaluation (Signed)
Anesthesia Evaluation  Patient identified by MRN, date of birth, ID band Patient awake    Reviewed: Allergy & Precautions, NPO status , Patient's Chart, lab work & pertinent test results  History of Anesthesia Complications Negative for: history of anesthetic complications  Airway Mallampati: II  TM Distance: >3 FB Neck ROM: Full    Dental no notable dental hx. (+) Dental Advisory Given   Pulmonary neg pulmonary ROS,    Pulmonary exam normal breath sounds clear to auscultation       Cardiovascular negative cardio ROS Normal cardiovascular exam Rhythm:Regular Rate:Normal     Neuro/Psych negative neurological ROS  negative psych ROS   GI/Hepatic negative GI ROS, Neg liver ROS,   Endo/Other  negative endocrine ROS  Renal/GU negative Renal ROS  negative genitourinary   Musculoskeletal negative musculoskeletal ROS (+)   Abdominal   Peds negative pediatric ROS (+)  Hematology negative hematology ROS (+)   Anesthesia Other Findings   Reproductive/Obstetrics (+) Pregnancy                             Anesthesia Physical  Anesthesia Plan  ASA: II  Anesthesia Plan: Spinal   Post-op Pain Management:    Induction:   PONV Risk Score and Plan: 2 and Treatment may vary due to age or medical condition  Airway Management Planned: Simple Face Mask  Additional Equipment:   Intra-op Plan:   Post-operative Plan:   Informed Consent: I have reviewed the patients History and Physical, chart, labs and discussed the procedure including the risks, benefits and alternatives for the proposed anesthesia with the patient or authorized representative who has indicated his/her understanding and acceptance.   Dental advisory given  Plan Discussed with:   Anesthesia Plan Comments:         Anesthesia Quick Evaluation

## 2018-10-04 ENCOUNTER — Encounter (HOSPITAL_COMMUNITY): Payer: Self-pay | Admitting: Obstetrics and Gynecology

## 2018-10-05 DIAGNOSIS — O30042 Twin pregnancy, dichorionic/diamniotic, second trimester: Secondary | ICD-10-CM | POA: Diagnosis not present

## 2018-10-05 DIAGNOSIS — O26872 Cervical shortening, second trimester: Secondary | ICD-10-CM | POA: Diagnosis not present

## 2018-10-26 DIAGNOSIS — O26872 Cervical shortening, second trimester: Secondary | ICD-10-CM | POA: Diagnosis not present

## 2018-10-26 DIAGNOSIS — Z3A24 24 weeks gestation of pregnancy: Secondary | ICD-10-CM | POA: Diagnosis not present

## 2018-10-26 DIAGNOSIS — O30042 Twin pregnancy, dichorionic/diamniotic, second trimester: Secondary | ICD-10-CM | POA: Diagnosis not present

## 2018-11-10 DIAGNOSIS — O3680X0 Pregnancy with inconclusive fetal viability, not applicable or unspecified: Secondary | ICD-10-CM | POA: Diagnosis not present

## 2018-11-10 DIAGNOSIS — Z3A26 26 weeks gestation of pregnancy: Secondary | ICD-10-CM | POA: Diagnosis not present

## 2018-11-18 DIAGNOSIS — O30043 Twin pregnancy, dichorionic/diamniotic, third trimester: Secondary | ICD-10-CM | POA: Diagnosis not present

## 2018-11-18 DIAGNOSIS — Z3A27 27 weeks gestation of pregnancy: Secondary | ICD-10-CM | POA: Diagnosis not present

## 2018-11-23 DIAGNOSIS — Z23 Encounter for immunization: Secondary | ICD-10-CM | POA: Diagnosis not present

## 2018-12-11 ENCOUNTER — Encounter (HOSPITAL_COMMUNITY): Payer: Self-pay | Admitting: *Deleted

## 2018-12-11 ENCOUNTER — Other Ambulatory Visit: Payer: Self-pay

## 2018-12-11 ENCOUNTER — Inpatient Hospital Stay (HOSPITAL_COMMUNITY)
Admission: AD | Admit: 2018-12-11 | Discharge: 2018-12-21 | DRG: 786 | Disposition: A | Discharge status: Home / Self Care | Payer: 59 | Attending: Obstetrics and Gynecology | Admitting: Obstetrics and Gynecology

## 2018-12-11 ENCOUNTER — Inpatient Hospital Stay (HOSPITAL_COMMUNITY): Payer: 59

## 2018-12-11 DIAGNOSIS — O411232 Chorioamnionitis, third trimester, fetus 2: Secondary | ICD-10-CM | POA: Diagnosis not present

## 2018-12-11 DIAGNOSIS — O4290 Premature rupture of membranes, unspecified as to length of time between rupture and onset of labor, unspecified weeks of gestation: Secondary | ICD-10-CM | POA: Diagnosis not present

## 2018-12-11 DIAGNOSIS — Z3A31 31 weeks gestation of pregnancy: Secondary | ICD-10-CM

## 2018-12-11 DIAGNOSIS — Z3483 Encounter for supervision of other normal pregnancy, third trimester: Secondary | ICD-10-CM | POA: Diagnosis not present

## 2018-12-11 DIAGNOSIS — O42913 Preterm premature rupture of membranes, unspecified as to length of time between rupture and onset of labor, third trimester: Secondary | ICD-10-CM | POA: Diagnosis not present

## 2018-12-11 DIAGNOSIS — Z3A32 32 weeks gestation of pregnancy: Secondary | ICD-10-CM | POA: Diagnosis not present

## 2018-12-11 DIAGNOSIS — O30003 Twin pregnancy, unspecified number of placenta and unspecified number of amniotic sacs, third trimester: Secondary | ICD-10-CM | POA: Diagnosis not present

## 2018-12-11 DIAGNOSIS — O3093 Multiple gestation, unspecified, third trimester: Secondary | ICD-10-CM | POA: Diagnosis not present

## 2018-12-11 DIAGNOSIS — Z3482 Encounter for supervision of other normal pregnancy, second trimester: Secondary | ICD-10-CM | POA: Diagnosis not present

## 2018-12-11 DIAGNOSIS — O322XX2 Maternal care for transverse and oblique lie, fetus 2: Secondary | ICD-10-CM | POA: Diagnosis present

## 2018-12-11 DIAGNOSIS — O42013 Preterm premature rupture of membranes, onset of labor within 24 hours of rupture, third trimester: Secondary | ICD-10-CM | POA: Diagnosis not present

## 2018-12-11 DIAGNOSIS — O30043 Twin pregnancy, dichorionic/diamniotic, third trimester: Secondary | ICD-10-CM | POA: Diagnosis not present

## 2018-12-11 DIAGNOSIS — O09293 Supervision of pregnancy with other poor reproductive or obstetric history, third trimester: Secondary | ICD-10-CM | POA: Diagnosis not present

## 2018-12-11 DIAGNOSIS — O26873 Cervical shortening, third trimester: Secondary | ICD-10-CM | POA: Diagnosis not present

## 2018-12-11 DIAGNOSIS — O42919 Preterm premature rupture of membranes, unspecified as to length of time between rupture and onset of labor, unspecified trimester: Secondary | ICD-10-CM | POA: Diagnosis present

## 2018-12-11 DIAGNOSIS — O4703 False labor before 37 completed weeks of gestation, third trimester: Secondary | ICD-10-CM | POA: Diagnosis not present

## 2018-12-11 LAB — URINALYSIS, ROUTINE W REFLEX MICROSCOPIC
BACTERIA UA: NONE SEEN
BILIRUBIN URINE: NEGATIVE
Glucose, UA: NEGATIVE mg/dL
Hgb urine dipstick: NEGATIVE
Ketones, ur: 5 mg/dL — AB
Nitrite: NEGATIVE
Protein, ur: NEGATIVE mg/dL
Specific Gravity, Urine: 1.012 (ref 1.005–1.030)
pH: 6 (ref 5.0–8.0)

## 2018-12-11 LAB — TYPE AND SCREEN
ABO/RH(D): O POS
ANTIBODY SCREEN: NEGATIVE

## 2018-12-11 LAB — POCT FERN TEST: POCT Fern Test: POSITIVE

## 2018-12-11 LAB — AMNISURE RUPTURE OF MEMBRANE (ROM) NOT AT ARMC: Amnisure ROM: POSITIVE

## 2018-12-11 LAB — ABO/RH: ABO/RH(D): O POS

## 2018-12-11 MED ORDER — DOCUSATE SODIUM 100 MG PO CAPS: 100.0000 mg | ORAL_CAPSULE | Freq: Every day | ORAL | Status: DC

## 2018-12-11 MED ORDER — PRENATAL MULTIVITAMIN CH: 1.0000 | ORAL_TABLET | Freq: Every day | ORAL | Status: DC

## 2018-12-11 MED ORDER — AMOXICILLIN 500 MG PO CAPS: 500.0000 mg | ORAL_CAPSULE | Freq: Three times a day (TID) | ORAL | Status: DC

## 2018-12-11 MED ORDER — AMOXICILLIN 500 MG PO CAPS
500.0000 mg | ORAL_CAPSULE | Freq: Three times a day (TID) | ORAL | Status: DC
  Administered 2018-12-13 – 2018-12-17 (×14): 500 mg via ORAL
  Filled 2018-12-11 (×15): qty 1

## 2018-12-11 MED ORDER — SODIUM CHLORIDE 0.9 % IV SOLN: 250.0000 mL | INTRAVENOUS | Status: DC | PRN

## 2018-12-11 MED ORDER — AZITHROMYCIN 250 MG PO TABS
500.0000 mg | ORAL_TABLET | Freq: Every day | ORAL | Status: AC
  Administered 2018-12-13 – 2018-12-17 (×5): 500 mg via ORAL
  Filled 2018-12-11 (×7): qty 2

## 2018-12-11 MED ORDER — SODIUM CHLORIDE 0.9% FLUSH
3.0000 mL | Freq: Two times a day (BID) | INTRAVENOUS | Status: DC
  Administered 2018-12-13 – 2018-12-14 (×2): 3 mL via INTRAVENOUS
  Administered 2018-12-14: 13:00:00 via INTRAVENOUS
  Administered 2018-12-15 – 2018-12-17 (×4): 3 mL via INTRAVENOUS

## 2018-12-11 MED ORDER — AZITHROMYCIN 250 MG PO TABS: 500.0000 mg | ORAL_TABLET | Freq: Every day | ORAL | Status: DC

## 2018-12-11 MED ORDER — LACTATED RINGERS IV SOLN
INTRAVENOUS | Status: DC
  Administered 2018-12-11 – 2018-12-13 (×7): via INTRAVENOUS

## 2018-12-11 MED ORDER — PROGESTERONE MICRONIZED 200 MG PO CAPS
200.0000 mg | ORAL_CAPSULE | Freq: Every day | ORAL | Status: DC
  Administered 2018-12-11 – 2018-12-16 (×6): 200 mg via VAGINAL
  Filled 2018-12-11 (×6): qty 1

## 2018-12-11 MED ORDER — CALCIUM CARBONATE ANTACID 500 MG PO CHEW
2.0000 | CHEWABLE_TABLET | ORAL | Status: DC | PRN
  Administered 2018-12-11: 400 mg via ORAL
  Filled 2018-12-11: qty 2

## 2018-12-11 MED ORDER — ACETAMINOPHEN 325 MG PO TABS
650.0000 mg | ORAL_TABLET | ORAL | Status: DC | PRN
  Administered 2018-12-12 – 2018-12-17 (×9): 650 mg via ORAL
  Filled 2018-12-11 (×9): qty 2

## 2018-12-11 MED ORDER — DOCUSATE SODIUM 100 MG PO CAPS
100.0000 mg | ORAL_CAPSULE | Freq: Every day | ORAL | Status: DC
  Administered 2018-12-11 – 2018-12-17 (×6): 100 mg via ORAL
  Filled 2018-12-11 (×7): qty 1

## 2018-12-11 MED ORDER — SODIUM CHLORIDE 0.9 % IV SOLN: 500.0000 mg | INTRAVENOUS | Status: DC

## 2018-12-11 MED ORDER — SODIUM CHLORIDE 0.9 % IV SOLN
500.0000 mg | INTRAVENOUS | Status: AC
  Administered 2018-12-11 – 2018-12-12 (×2): 500 mg via INTRAVENOUS
  Filled 2018-12-11 (×2): qty 500

## 2018-12-11 MED ORDER — ZOLPIDEM TARTRATE 5 MG PO TABS: 5.0000 mg | ORAL_TABLET | Freq: Every evening | ORAL | Status: DC | PRN

## 2018-12-11 MED ORDER — SODIUM CHLORIDE 0.9 % IV SOLN
2.0000 g | Freq: Four times a day (QID) | INTRAVENOUS | Status: AC
  Administered 2018-12-11 – 2018-12-13 (×8): 2 g via INTRAVENOUS
  Filled 2018-12-11 (×8): qty 2000

## 2018-12-11 MED ORDER — CALCIUM CARBONATE ANTACID 500 MG PO CHEW: 2.0000 | CHEWABLE_TABLET | ORAL | Status: DC | PRN

## 2018-12-11 MED ORDER — SODIUM CHLORIDE 0.9% FLUSH
3.0000 mL | INTRAVENOUS | Status: DC | PRN
  Administered 2018-12-13: 3 mL via INTRAVENOUS
  Filled 2018-12-11: qty 3

## 2018-12-11 MED ORDER — BETAMETHASONE SOD PHOS & ACET 6 (3-3) MG/ML IJ SUSP
12.0000 mg | INTRAMUSCULAR | Status: AC
  Administered 2018-12-11 – 2018-12-12 (×2): 12 mg via INTRAMUSCULAR
  Filled 2018-12-11 (×3): qty 2

## 2018-12-11 MED ORDER — SODIUM CHLORIDE 0.9 % IV SOLN: 2.0000 g | Freq: Four times a day (QID) | INTRAVENOUS | Status: DC

## 2018-12-11 MED ORDER — PRENATAL MULTIVITAMIN CH
1.0000 | ORAL_TABLET | Freq: Every day | ORAL | Status: DC
  Administered 2018-12-11 – 2018-12-17 (×7): 1 via ORAL
  Filled 2018-12-11 (×7): qty 1

## 2018-12-11 MED ORDER — BETAMETHASONE SOD PHOS & ACET 6 (3-3) MG/ML IJ SUSP: 12.0000 mg | INTRAMUSCULAR | Status: DC

## 2018-12-11 NOTE — Progress Notes (Signed)
Follow up visit with Emily Richards who was admitted for pprom.  Pt reports she is feeling a bit better since having her ultrasound and learning her baby's sizes.  She learned she would have to stay in the hospital until she reaches 34 weeks and went ahead and sent her husband home to be with her daughter.  She was sad to learn that her daughter is unable to visit her at this time due to flu restrictions.    Please page as further needs arise.  [name]. Carley Hammed, M.Div. [facility] Chaplain Pager [phone] Office [phone]

## 2018-12-11 NOTE — MAU Note (Signed)
Pt presents to MAU with complaints of leakage of fluid starting around 6 this morning. Denies any pain. +FM

## 2018-12-11 NOTE — MAU Provider Note (Signed)
First Provider Initiated Contact with Patient 12/11/18 [phone]       S: Ms. Devonta Carraro is a 33 y.o. G3P2002 at [redacted]w[redacted]d  who presents to MAU today complaining of leaking of fluid since 0600. She denies vaginal bleeding. She denies contractions. She reports normal fetal movement.    O: BP 135/88   Pulse (!) 102   Temp 98.4 F (36.9 C)   Resp 16   LMP 05/14/2018   SpO2 99%  GENERAL: Well-developed, well-nourished female in no acute distress.  HEAD: Normocephalic, atraumatic.  CHEST: Normal effort of breathing, regular heart rate ABDOMEN: Soft, nontender, gravid PELVIC: Normal external female genitalia. Copious clear fluid noted on the perineum.   Cervical exam: deferred. No pain and patient is not feeling contractions.      Fetal Monitoring: A Baseline: 140 Variability: moderate Accelerations: 125x15 Decelerations: none Contractions: irreg  B Baseline: 155 Variability: moderate Accels: 15x15 Decels: none Toco: irreg    Results for orders placed or performed during the hospital encounter of 12/11/18 (from the past 24 hour(s))  Fern Test     Status: Abnormal   Collection Time: 12/11/18  8:20 AM  Result Value Ref Range   POCT Fern Test Positive = ruptured amniotic membanes      A: [facility] at [redacted]w[redacted]d  [facility]  P: Admit to OB speciality care   Latency antibiotics BMZ series  Dr. Ellyn Hack notified    Thressa Sheller DNP, CNM  12/11/18  8:25 AM

## 2018-12-11 NOTE — Progress Notes (Signed)
Initial visit with patient and family to introduce spiritual care services and offer support.  Pt stated, "I don't want to lose them or I don't want one of them to die and the other one to live."  Chaplain normalized pt's reaction and worry over being in the hospital so much before her due date, and inquired about whether she had been led to believe this might be the case.  Pt acknowledged no one had told her that she was likely to lose either or both of her babies and reminded her that she is in good care.  Offered pt to talk about her experience with her water breaking and emotional support.  Pt is aware that continual support is available to her through her stay.    Please page as further needs arise.  [name]. Carley Hammed, M.Div. [facility] Chaplain Pager [phone] Office [phone]

## 2018-12-11 NOTE — Progress Notes (Signed)
Patient ID: Zowie Hoffmeyer, female   DOB: 30-Mar-1986, 33 y.o.   MRN: [id number]   Cont LOF not as much, no VB, + FM, irr ctx - appreciates some, but not all.    AFVSS  gen NAD FHTs A 110-150's (mostly 130-140's), mod var, + accel; (10X10) category 1 B 120-150's,(mostly 140-150's) mod var, + accel; (10x10) category 1 toco IRR CTX  [place] A vtx, ant plac, nl AFI 5.5, 3#8, 42% nl anat B trans head maternal, ant plac, nl AFI 7.06; 3#5, 62%, nl anat 11% discordant  D/W pt US findings and POC On Zithro/Amp S/p BMZ  D/w pt presentation and mode for delivery.  Pt desires cesarean section for delivery.  D/W her cerclage and removal.    Text to Dr Judeth Cornfield re AFI. Also call in to Admitting provider from MAU.

## 2018-12-11 NOTE — Consult Note (Signed)
Neonatology Consult to Antenatal Patient: 12/11/2018 11:59 AM    I was requested by Dr Ellyn Hack to see this patient in order to provide antenatal counseling due to PPROM at 30 1/7 weeks Twin gestation.    Emily Richards is a 33 y/o G3P2 who was admitted today for PPROM and is now 36 1/[redacted] weeks GA.   She had a cerclage placed at 19 weeks and is currently having some mild intermittent contractions.  She is getting BMZ, Ampicillin and Azithromycin.  I spoke with Emily Richards and FOB in Room 104.   We discussed in detail what to expect in case of possible delivery of the infant in the next few days including morbidity and mortality at this gestational age with Di-Di twins, usual delivery room resuscitation including intubation and surfactant administration in the DR.  Discussed possible respiratory complications and need for support including mechanical ventilation, IV access, sepsis work-up, NG/OG feedings ( benefits of BF and availability of DBM), risk for IVH with the potential for motor/cognitive deficits, length of stay and long-term outcome.  They were attentive, had appropriate questions, and expressed appreciation for my input.     Thank you for asking [place] to see this patient and allowing [place] to participate in her care.  Please call if there are any further questions.   Overton Mam, MD (Attending Neonatologist)  Total length of face-to-face or floor/unit time for this encounter was 40 minutes. Counseling and/or coordination of care was greater than fifty percent of the above time.

## 2018-12-11 NOTE — H&P (Signed)
Emily Richards is a 33 y.o. female G3P2002 at 31+1 [facility] 02/11/19) with di/di twins and PPROM at 6am.  Leaking clear fluid.  Pregnancy dated by early [place].  Spontaneous conception.  Panorama - low risk female x 2.  Cerclage placed at 19 wk, due to short CL.  Received Tdap and Flu vaccines.  Has had som eHA in pregnancy.    OB History    Gravida  3   Para  2   Term  2   Preterm      AB      Living  2     SAB      TAB      Ectopic      Multiple  0   Live Births  2         G1 VAVD female, 5#12, 8/08 - had PPH G2 SVD 8#8 female 7/16 G3 present - di/di twins, cerclage  + abn pap, last WNL HR HPV neg No STD Past Medical History:  Diagnosis Date  . Postpartum hemorrhage    Past Surgical History:  Procedure Laterality Date  . CERVICAL CERCLAGE N/A 09/22/2018   Procedure: CERCLAGE CERVICAL;  Surgeon: Huel Cote, MD;  Location: [facility] BIRTHING SUITES;  Service: Gynecology;  Laterality: N/A;  . WISDOM TOOTH EXTRACTION     Family History: family history includes Heart disease in her paternal grandmother; Other in her father. Social History:  reports that she has never smoked. She has never used smokeless tobacco. She reports that she does not drink alcohol or use drugs. married, CMA  Meds Fiorcet, PNV, prometrium, Diclegis All NKDA     Maternal Diabetes: No Genetic Screening: Normal Maternal Ultrasounds/Referrals: Normal Fetal Ultrasounds or other Referrals:  None Maternal Substance Abuse:  No Significant Maternal Medications:  Meds include: Other: progesterone Significant Maternal Lab Results:  None Other Comments:  cerclage placed at 19 wk, di/di twins.  PPROM A  Review of Systems  Constitutional: Negative.   HENT: Negative.   Eyes: Negative.   Respiratory: Negative.   Cardiovascular: Negative.   Gastrointestinal: Negative.   Genitourinary: Negative.   Musculoskeletal: Negative.   Skin: Negative.   Neurological: Negative.   Psychiatric/Behavioral:  Negative.    Maternal Medical History:  Reason for admission: Rupture of membranes.   Contractions: Frequency: irregular.   Perceived severity is mild.    Fetal activity: Perceived fetal activity is normal.    Prenatal complications: Di/di twins  Prenatal Complications - Diabetes: none.      Blood pressure 135/88, pulse (!) 102, temperature 98.4 F (36.9 C), resp. rate 16, last menstrual period 05/14/2018, SpO2 99 %. Maternal Exam:  Uterine Assessment: Contraction strength is mild.  Contraction frequency is regular.   Abdomen: Patient reports no abdominal tenderness. Fundal height is appropriate for gestation.    Introitus: Normal vulva. Normal vagina.    Physical Exam  Constitutional: She is oriented to person, place, and time. She appears well-developed and well-nourished.  HENT:  Head: Normocephalic and atraumatic.  Cardiovascular: Normal rate and regular rhythm.  Respiratory: Breath sounds normal. No respiratory distress. She has no wheezes.  GI: Soft. Bowel sounds are normal. There is no abdominal tenderness.  Genitourinary:    Vulva normal.   Musculoskeletal: Normal range of motion.  Neurological: She is alert and oriented to person, place, and time.  Skin: Skin is warm and dry.  Psychiatric: She has a normal mood and affect. Her behavior is normal.    Prenatal labs: ABO, Rh: O/Positive/-- (10/11 0000)  Antibody: n (10/11 0000) Rubella: Immune (10/11 0000) RPR: Nonreactive (10/11 0000)  HBsAg: Negative (10/11 0000)  HIV: Non-reactive (10/11 0000)  GBS:   pending  Flu 07/29/18, Tdap 11/23/18  Hgb 12.5/Plt 235/Ur Cx neg/GC neg/Chl neg/Varicella immune/Hgb electro has trait/AFP WNL/Panorama low risk x 2, female x 2  Concordant growth  glucola 128  Assessment/Plan: 32yo G3P2002 at 75 w Di/Di twins, PPROM BMZ x 2 [place] for growth and presentation (A was vertex) Amp/Zithro for latency NICU consult  D/w pt delivery by 34 weeks, abx for latency, BMZ for lung  maturity Counseled pt re preterm precautions and bleeding precautions Await Magnesium for impending delivery  Delicia Berens Bovard-Stuckert 12/11/2018, 9:17 AM

## 2018-12-12 LAB — CBC WITH DIFFERENTIAL/PLATELET
Abs Immature Granulocytes: 0.48 10*3/uL — ABNORMAL HIGH (ref 0.00–0.07)
Basophils Absolute: 0 10*3/uL (ref 0.0–0.1)
Basophils Relative: 0 %
Eosinophils Absolute: 0 10*3/uL (ref 0.0–0.5)
Eosinophils Relative: 0 %
HCT: 30.9 % — ABNORMAL LOW (ref 36.0–46.0)
Hemoglobin: 10 g/dL — ABNORMAL LOW (ref 12.0–15.0)
Immature Granulocytes: 4 %
Lymphocytes Relative: 10 %
Lymphs Abs: 1.4 10*3/uL (ref 0.7–4.0)
MCH: 27.7 pg (ref 26.0–34.0)
MCHC: 32.4 g/dL (ref 30.0–36.0)
MCV: 85.6 fL (ref 80.0–100.0)
MONOS PCT: 6 %
Monocytes Absolute: 0.8 10*3/uL (ref 0.1–1.0)
Neutro Abs: 10.9 10*3/uL — ABNORMAL HIGH (ref 1.7–7.7)
Neutrophils Relative %: 80 %
Platelets: 213 10*3/uL (ref 150–400)
RBC: 3.61 MIL/uL — ABNORMAL LOW (ref 3.87–5.11)
RDW: 14.1 % (ref 11.5–15.5)
WBC: 13.6 10*3/uL — ABNORMAL HIGH (ref 4.0–10.5)
nRBC: 0 % (ref 0.0–0.2)
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
## 2018-12-12 NOTE — Progress Notes (Signed)
Patient ID: Emily Richards, female   DOB: August 16, 1986, 33 y.o.   MRN: [id number] Pt reports no contractions. Notes scant leakage when uses the bathroom but none otherwise. +Fms. Has no complaints.  VSS EFM - Baby A 140, cat 1            Baby B 140 cat 1 TOCO - no contractions SVE - deferred  Abd - cw did di twins at [redacted] weeks ga  A/P: Di/Di twins at 11 2/7wks with PPROM - afeb, stable         S/P BMZ on 2/23 and 2/24         On amp/zithro for latency         Consider MgSO4 for CP prophylaxis if regular ctxs resume         Continue current care

## 2018-12-12 NOTE — Progress Notes (Signed)
Patient ID: Emily Richards, female   DOB: 02-Jun-1986, 33 y.o.   MRN: [id number]   Feeling well this am, some LOF, clear, no VB, + FM x 2, occ, only feel occasional ctx, amnisure positive.  AFVSS  Due for BMZ at 10am Continue Amp/Zithro  gen NAD FHTs 120-130's mod var, category 1 FHTs 130-150's mod var, category 1  Ctx q 6-59min Abd soft, gravid, NT  Continue current mgmt.

## 2018-12-13 LAB — CULTURE, BETA STREP (GROUP B ONLY)

## 2018-12-13 LAB — GC/CHLAMYDIA PROBE AMP (~~LOC~~) NOT AT ARMC
Chlamydia: NEGATIVE
Neisseria Gonorrhea: NEGATIVE

## 2018-12-13 MED ORDER — LACTATED RINGERS IV BOLUS
500.0000 mL | Freq: Once | INTRAVENOUS | Status: AC
  Administered 2018-12-13: 500 mL via INTRAVENOUS

## 2018-12-13 MED ORDER — MAGNESIUM SULFATE BOLUS VIA INFUSION
6.0000 g | Freq: Once | INTRAVENOUS | Status: AC
  Administered 2018-12-13: 6 g via INTRAVENOUS
  Filled 2018-12-13: qty 500

## 2018-12-13 MED ORDER — GUAIFENESIN 100 MG/5ML PO SOLN
5.0000 mL | ORAL | Status: DC | PRN
  Administered 2018-12-13 – 2018-12-15 (×4): 100 mg via ORAL
  Filled 2018-12-13 (×4): qty 15

## 2018-12-13 MED ORDER — PROMETHAZINE HCL 25 MG PO TABS: 25.0000 mg | ORAL_TABLET | Freq: Three times a day (TID) | ORAL | Status: DC | PRN

## 2018-12-13 MED ORDER — MAGNESIUM SULFATE 40 G IN LACTATED RINGERS - SIMPLE
1.0000 g/h | INTRAVENOUS | Status: DC
  Administered 2018-12-13: 1 g/h via INTRAVENOUS

## 2018-12-13 MED ORDER — PANTOPRAZOLE SODIUM 40 MG PO TBEC
40.0000 mg | DELAYED_RELEASE_TABLET | Freq: Every day | ORAL | Status: DC
  Administered 2018-12-13 – 2018-12-17 (×5): 40 mg via ORAL
  Filled 2018-12-13 (×5): qty 1

## 2018-12-13 NOTE — Progress Notes (Signed)
Patient ID: Emily Richards, female   DOB: [birth year], 33 y.o.   MRN: [id number] Pt feels well, no significant contractions since off magnesium at 200pm  NST category 1 x 2 so change to monitoring q shift  D/w pt will continue expectant management until she labors or has s/s of infection She still prefers c-section unless vtx/vtx Mucinex for cough and Ambien for sleep

## 2018-12-13 NOTE — Progress Notes (Addendum)
Dr Mindi Slicker called d/t increased cx and LOF, as well as new onset back pain w/ cx. Cervix checked and at 2.5cm. Dr ordered 500 ml Bolus of LR, 6g bolus of Magnesium Sulfate with 1g/hr maintenance dose.

## 2018-12-13 NOTE — Progress Notes (Signed)
Patient ID: Emily Richards, female   DOB: 1986/05/22, 33 y.o.   MRN: [id number] HD #3 PPROM 31 3/7 weeks, twins  Pt doing better this AM, states contractions are much improved and feels nothing on the magnesium.  +LOF  afeb VSS FHR category 1 x 2  Gravid NT  S/p betamethasone x 2 Latency antibiotics still GBS preliminary negative Will d/c magnesium this PM after 12 hours if no recurrent contractions.

## 2018-12-14 LAB — TYPE AND SCREEN
ABO/RH(D): O POS
Antibody Screen: NEGATIVE

## 2018-12-14 MED ORDER — FAMOTIDINE 20 MG PO TABS
20.0000 mg | ORAL_TABLET | Freq: Two times a day (BID) | ORAL | Status: DC
  Administered 2018-12-14 – 2018-12-17 (×6): 20 mg via ORAL
  Filled 2018-12-14 (×6): qty 1

## 2018-12-14 MED ORDER — MENTHOL 3 MG MT LOZG: 1.0000 | LOZENGE | OROMUCOSAL | Status: DC | PRN

## 2018-12-14 NOTE — Progress Notes (Signed)
Patient ID: Emily Richards, female   DOB: [birth year], 33 y.o.   MRN: [id number]  31+ di/di twins, PPROM   +FM x 2, cont LOF, no VB, some ctx yesterday, s/p Magnesium for CP prophylaxis.  AFVSS  gen NAD  FHTs 120-130, mod var, + accel, category 1 130-150, mod var, + accels category 1 toco irr  Abd soft, gravid NT  Continue abx for latency Amp/Zithro  S/p BMZ Has spoke to NICU Continue current mgmt

## 2018-12-15 NOTE — Progress Notes (Signed)
Follow up visit with Emily Richards.  She reports the babies are doing well and she is coping well.  She does miss her daughter, [name], and feels sad to be away from her but knows that she needs to be resting.  Pt is grateful for company.  Please page as further needs arise.  [name]. Carley Hammed, M.Div. [facility] Chaplain Pager [phone] Office [phone]

## 2018-12-15 NOTE — Progress Notes (Signed)
Patient ID: Emily Richards, female   DOB: 1986-08-22, 33 y.o.   MRN: [id number] HD#5  31 5/7 weeks PPROM  Pt doing well.  No c/o or contractions.   Good FM x 2 +LOF  afeb vss FHR category 1 x 2 Occasional mild contractions  On latency abx--GBS negative D/w pt plan of delivery at 34 weeks unless labors prior  S/p betamethasone x 2 and NICU consult

## 2018-12-16 NOTE — Progress Notes (Addendum)
Patient ID: Emily Richards, female   DOB: [birth year], 32 y.o.   MRN: [id number] Pt doing well. Was teary earlier after talking to her daughter; misses her. She reports no fever, contractions or discomfort. She is appreciating FMs and continued LOF. Still desires c/s if baby B not vertex.  VS -120/86, afeb EFM A - 150, cat 1         B - 135, cat 1 TOCO - no contractions SVE - deferred  A/P: Di/di twin pregnancy at 12 6/7wks with cerclage in place, PPROM - stable           S/P NICU consult            S/P BMZ on 2/23 and 2/24            S/P MgSO4 for CP prophylaxis on 2/25 x 24 hrs            On latency antibx since 2/23; complete on 12/18/18            Plan on delivery at 34 weeks/earlier if labor/infection            Check AFI/BPP 12/18/18 - order placed

## 2018-12-17 ENCOUNTER — Inpatient Hospital Stay (HOSPITAL_COMMUNITY): Payer: 59 | Admitting: Anesthesiology

## 2018-12-17 ENCOUNTER — Encounter (HOSPITAL_COMMUNITY)
Admission: AD | Disposition: A | Discharge status: Home / Self Care | Payer: Self-pay | Source: Home / Self Care | Attending: Obstetrics and Gynecology

## 2018-12-17 ENCOUNTER — Inpatient Hospital Stay (HOSPITAL_COMMUNITY): Payer: 59

## 2018-12-17 ENCOUNTER — Encounter (HOSPITAL_COMMUNITY): Payer: Self-pay | Admitting: Neonatal-Perinatal Medicine

## 2018-12-17 DIAGNOSIS — Z3A32 32 weeks gestation of pregnancy: Secondary | ICD-10-CM

## 2018-12-17 DIAGNOSIS — O4703 False labor before 37 completed weeks of gestation, third trimester: Secondary | ICD-10-CM

## 2018-12-17 DIAGNOSIS — O42913 Preterm premature rupture of membranes, unspecified as to length of time between rupture and onset of labor, third trimester: Secondary | ICD-10-CM

## 2018-12-17 DIAGNOSIS — O09293 Supervision of pregnancy with other poor reproductive or obstetric history, third trimester: Secondary | ICD-10-CM

## 2018-12-17 LAB — CBC
HCT: 31.7 % — ABNORMAL LOW (ref 36.0–46.0)
Hemoglobin: 10.5 g/dL — ABNORMAL LOW (ref 12.0–15.0)
MCH: 27.9 pg (ref 26.0–34.0)
MCHC: 33.1 g/dL (ref 30.0–36.0)
MCV: 84.3 fL (ref 80.0–100.0)
PLATELETS: 211 10*3/uL (ref 150–400)
RBC: 3.76 MIL/uL — ABNORMAL LOW (ref 3.87–5.11)
RDW: 14.2 % (ref 11.5–15.5)
WBC: 18.2 10*3/uL — ABNORMAL HIGH (ref 4.0–10.5)
nRBC: 0 % (ref 0.0–0.2)

## 2018-12-17 LAB — TYPE AND SCREEN
ABO/RH(D): O POS
Antibody Screen: NEGATIVE

## 2018-12-17 SURGERY — Surgical Case
Anesthesia: Spinal | Site: Abdomen | Wound class: Clean Contaminated

## 2018-12-17 MED ORDER — COCONUT OIL OIL
1.0000 "application " | TOPICAL_OIL | Status: DC | PRN
  Administered 2018-12-17: 1 via TOPICAL

## 2018-12-17 MED ORDER — SODIUM CHLORIDE 0.9 % IV SOLN
INTRAVENOUS | Status: DC | PRN
  Administered 2018-12-17: 60 ug/min via INTRAVENOUS

## 2018-12-17 MED ORDER — KETOROLAC TROMETHAMINE 30 MG/ML IJ SOLN
30.0000 mg | Freq: Once | INTRAMUSCULAR | Status: AC | PRN
  Administered 2018-12-17: 30 mg via INTRAVENOUS

## 2018-12-17 MED ORDER — DIBUCAINE 1 % RE OINT: 1.0000 "application " | TOPICAL_OINTMENT | RECTAL | Status: DC | PRN

## 2018-12-17 MED ORDER — OXYTOCIN 10 UNIT/ML IJ SOLN
INTRAMUSCULAR | Status: DC | PRN
  Administered 2018-12-17: 40 [IU]

## 2018-12-17 MED ORDER — LACTATED RINGERS IV SOLN
INTRAVENOUS | Status: DC
  Administered 2018-12-18: 04:00:00 via INTRAVENOUS

## 2018-12-17 MED ORDER — SIMETHICONE 80 MG PO CHEW
80.0000 mg | CHEWABLE_TABLET | ORAL | Status: DC
  Administered 2018-12-18 – 2018-12-20 (×3): 80 mg via ORAL
  Filled 2018-12-17 (×4): qty 1

## 2018-12-17 MED ORDER — NIFEDIPINE 10 MG PO CAPS
10.0000 mg | ORAL_CAPSULE | ORAL | Status: DC | PRN
  Administered 2018-12-17 (×2): 10 mg via ORAL
  Filled 2018-12-17 (×2): qty 1

## 2018-12-17 MED ORDER — OXYTOCIN 40 UNITS IN NORMAL SALINE INFUSION - SIMPLE MED
INTRAVENOUS | Status: AC
  Filled 2018-12-17: qty 1000

## 2018-12-17 MED ORDER — KETOROLAC TROMETHAMINE 30 MG/ML IJ SOLN: 30.0000 mg | Freq: Four times a day (QID) | INTRAMUSCULAR | Status: AC | PRN

## 2018-12-17 MED ORDER — SENNOSIDES-DOCUSATE SODIUM 8.6-50 MG PO TABS
2.0000 | ORAL_TABLET | ORAL | Status: DC
  Administered 2018-12-18 – 2018-12-20 (×2): 2 via ORAL
  Filled 2018-12-17 (×3): qty 2

## 2018-12-17 MED ORDER — NALBUPHINE HCL 10 MG/ML IJ SOLN: 5.0000 mg | INTRAMUSCULAR | Status: DC | PRN

## 2018-12-17 MED ORDER — MORPHINE SULFATE-NACL 0.5-0.9 MG/ML-% IV SOSY: PREFILLED_SYRINGE | INTRAVENOUS | Status: DC | PRN

## 2018-12-17 MED ORDER — LACTATED RINGERS IV BOLUS
500.0000 mL | Freq: Once | INTRAVENOUS | Status: AC
  Administered 2018-12-17: 500 mL via INTRAVENOUS

## 2018-12-17 MED ORDER — SOD CITRATE-CITRIC ACID 500-334 MG/5ML PO SOLN
ORAL | Status: AC
  Filled 2018-12-17: qty 15

## 2018-12-17 MED ORDER — WITCH HAZEL-GLYCERIN EX PADS: 1.0000 "application " | MEDICATED_PAD | CUTANEOUS | Status: DC | PRN

## 2018-12-17 MED ORDER — LACTATED RINGERS IV SOLN
INTRAVENOUS | Status: DC | PRN
  Administered 2018-12-17 (×2): via INTRAVENOUS

## 2018-12-17 MED ORDER — DIPHENHYDRAMINE HCL 25 MG PO CAPS: 25.0000 mg | ORAL_CAPSULE | Freq: Four times a day (QID) | ORAL | Status: DC | PRN

## 2018-12-17 MED ORDER — KETOROLAC TROMETHAMINE 30 MG/ML IJ SOLN
INTRAMUSCULAR | Status: AC
  Filled 2018-12-17: qty 1

## 2018-12-17 MED ORDER — SODIUM CHLORIDE 0.9% FLUSH: 3.0000 mL | INTRAVENOUS | Status: DC | PRN

## 2018-12-17 MED ORDER — SIMETHICONE 80 MG PO CHEW
80.0000 mg | CHEWABLE_TABLET | ORAL | Status: DC | PRN
  Administered 2018-12-17: 80 mg via ORAL

## 2018-12-17 MED ORDER — ONDANSETRON HCL 4 MG/2ML IJ SOLN: 4.0000 mg | Freq: Three times a day (TID) | INTRAMUSCULAR | Status: DC | PRN

## 2018-12-17 MED ORDER — SCOPOLAMINE 1 MG/3DAYS TD PT72
MEDICATED_PATCH | TRANSDERMAL | Status: AC
  Filled 2018-12-17: qty 1

## 2018-12-17 MED ORDER — NALOXONE HCL 0.4 MG/ML IJ SOLN: 0.4000 mg | INTRAMUSCULAR | Status: DC | PRN

## 2018-12-17 MED ORDER — HYDROMORPHONE HCL 1 MG/ML IJ SOLN
0.2500 mg | INTRAMUSCULAR | Status: DC | PRN
  Administered 2018-12-17: 0.5 mg via INTRAVENOUS

## 2018-12-17 MED ORDER — ACETAMINOPHEN 500 MG PO TABS
1000.0000 mg | ORAL_TABLET | Freq: Four times a day (QID) | ORAL | Status: DC
  Administered 2018-12-17 – 2018-12-21 (×13): 1000 mg via ORAL
  Filled 2018-12-17 (×14): qty 2

## 2018-12-17 MED ORDER — TETANUS-DIPHTH-ACELL PERTUSSIS 5-2.5-18.5 LF-MCG/0.5 IM SUSP: 0.5000 mL | Freq: Once | INTRAMUSCULAR | Status: DC

## 2018-12-17 MED ORDER — FENTANYL CITRATE (PF) 100 MCG/2ML IJ SOLN
INTRAMUSCULAR | Status: AC
  Filled 2018-12-17: qty 2

## 2018-12-17 MED ORDER — DEXAMETHASONE SODIUM PHOSPHATE 10 MG/ML IJ SOLN
INTRAMUSCULAR | Status: DC | PRN
  Administered 2018-12-17: 4 mg via INTRAVENOUS

## 2018-12-17 MED ORDER — MEPERIDINE HCL 25 MG/ML IJ SOLN: 6.2500 mg | INTRAMUSCULAR | Status: DC | PRN

## 2018-12-17 MED ORDER — SIMETHICONE 80 MG PO CHEW
80.0000 mg | CHEWABLE_TABLET | Freq: Three times a day (TID) | ORAL | Status: DC
  Administered 2018-12-18 – 2018-12-20 (×9): 80 mg via ORAL
  Filled 2018-12-17 (×9): qty 1

## 2018-12-17 MED ORDER — HYDROCODONE-ACETAMINOPHEN 7.5-325 MG PO TABS: 1.0000 | ORAL_TABLET | Freq: Once | ORAL | Status: DC | PRN

## 2018-12-17 MED ORDER — ONDANSETRON HCL 4 MG/2ML IJ SOLN
INTRAMUSCULAR | Status: DC | PRN
  Administered 2018-12-17: 4 mg via INTRAVENOUS

## 2018-12-17 MED ORDER — CEFAZOLIN SODIUM-DEXTROSE 2-4 GM/100ML-% IV SOLN
INTRAVENOUS | Status: AC
  Filled 2018-12-17: qty 100

## 2018-12-17 MED ORDER — DIPHENHYDRAMINE HCL 50 MG/ML IJ SOLN: 12.5000 mg | INTRAMUSCULAR | Status: DC | PRN

## 2018-12-17 MED ORDER — ACETAMINOPHEN 325 MG PO TABS: 325.0000 mg | ORAL_TABLET | ORAL | Status: DC | PRN

## 2018-12-17 MED ORDER — OXYTOCIN 40 UNITS IN NORMAL SALINE INFUSION - SIMPLE MED: 2.5000 [IU]/h | INTRAVENOUS | Status: AC

## 2018-12-17 MED ORDER — PHENYLEPHRINE HCL-NACL 20-0.9 MG/250ML-% IV SOLN
INTRAVENOUS | Status: AC
  Filled 2018-12-17: qty 250

## 2018-12-17 MED ORDER — OXYCODONE HCL 5 MG PO TABS
5.0000 mg | ORAL_TABLET | ORAL | Status: DC | PRN
  Administered 2018-12-18 – 2018-12-19 (×2): 5 mg via ORAL
  Administered 2018-12-19 (×2): 10 mg via ORAL
  Administered 2018-12-19: 5 mg via ORAL
  Administered 2018-12-20 – 2018-12-21 (×3): 10 mg via ORAL
  Filled 2018-12-17 (×2): qty 1
  Filled 2018-12-17 (×4): qty 2
  Filled 2018-12-17: qty 1
  Filled 2018-12-17: qty 2

## 2018-12-17 MED ORDER — MORPHINE SULFATE (PF) 0.5 MG/ML IJ SOLN
INTRAMUSCULAR | Status: AC
  Filled 2018-12-17: qty 10

## 2018-12-17 MED ORDER — IBUPROFEN 800 MG PO TABS
800.0000 mg | ORAL_TABLET | Freq: Three times a day (TID) | ORAL | Status: AC
  Administered 2018-12-18 – 2018-12-20 (×8): 800 mg via ORAL
  Filled 2018-12-17 (×8): qty 1

## 2018-12-17 MED ORDER — ACETAMINOPHEN 160 MG/5ML PO SOLN: 325.0000 mg | ORAL | Status: DC | PRN

## 2018-12-17 MED ORDER — LACTATED RINGERS IV BOLUS
1000.0000 mL | Freq: Once | INTRAVENOUS | Status: AC
  Administered 2018-12-17: 1000 mL via INTRAVENOUS

## 2018-12-17 MED ORDER — HYDROMORPHONE HCL 1 MG/ML IJ SOLN
INTRAMUSCULAR | Status: AC
  Filled 2018-12-17: qty 0.5

## 2018-12-17 MED ORDER — DIPHENHYDRAMINE HCL 25 MG PO CAPS: 25.0000 mg | ORAL_CAPSULE | ORAL | Status: DC | PRN

## 2018-12-17 MED ORDER — NIFEDIPINE 10 MG PO CAPS
10.0000 mg | ORAL_CAPSULE | Freq: Four times a day (QID) | ORAL | Status: DC | PRN
  Administered 2018-12-17: 10 mg via ORAL
  Filled 2018-12-17: qty 1

## 2018-12-17 MED ORDER — ZOLPIDEM TARTRATE 5 MG PO TABS: 5.0000 mg | ORAL_TABLET | Freq: Every evening | ORAL | Status: DC | PRN

## 2018-12-17 MED ORDER — MORPHINE SULFATE (PF) 0.5 MG/ML IJ SOLN
INTRAMUSCULAR | Status: DC | PRN
  Administered 2018-12-17: 150 ug via INTRATHECAL

## 2018-12-17 MED ORDER — ONDANSETRON HCL 4 MG/2ML IJ SOLN: 4.0000 mg | Freq: Once | INTRAMUSCULAR | Status: DC | PRN

## 2018-12-17 MED ORDER — NALBUPHINE HCL 10 MG/ML IJ SOLN: 5.0000 mg | Freq: Once | INTRAMUSCULAR | Status: DC | PRN

## 2018-12-17 MED ORDER — SODIUM CHLORIDE 0.9 % IV SOLN
INTRAVENOUS | Status: DC | PRN
  Administered 2018-12-17: 18:00:00 via INTRAVENOUS

## 2018-12-17 MED ORDER — PRENATAL MULTIVITAMIN CH
1.0000 | ORAL_TABLET | Freq: Every day | ORAL | Status: DC
  Administered 2018-12-18 – 2018-12-20 (×3): 1 via ORAL
  Filled 2018-12-17 (×3): qty 1

## 2018-12-17 MED ORDER — BUPIVACAINE IN DEXTROSE 0.75-8.25 % IT SOLN
INTRATHECAL | Status: DC | PRN
  Administered 2018-12-17: 1.6 mL via INTRATHECAL

## 2018-12-17 MED ORDER — MENTHOL 3 MG MT LOZG: 1.0000 | LOZENGE | OROMUCOSAL | Status: DC | PRN

## 2018-12-17 MED ORDER — NALOXONE HCL 4 MG/10ML IJ SOLN
1.0000 ug/kg/h | INTRAVENOUS | Status: DC | PRN
  Filled 2018-12-17: qty 5

## 2018-12-17 MED ORDER — SCOPOLAMINE 1 MG/3DAYS TD PT72
1.0000 | MEDICATED_PATCH | Freq: Once | TRANSDERMAL | Status: DC
  Filled 2018-12-17: qty 1

## 2018-12-17 MED ORDER — CEFAZOLIN SODIUM-DEXTROSE 2-3 GM-%(50ML) IV SOLR
INTRAVENOUS | Status: DC | PRN
  Administered 2018-12-17: 2 g via INTRAVENOUS

## 2018-12-17 MED ORDER — SCOPOLAMINE 1 MG/3DAYS TD PT72
MEDICATED_PATCH | TRANSDERMAL | Status: DC | PRN
  Administered 2018-12-17: 1 via TRANSDERMAL

## 2018-12-17 MED ORDER — FENTANYL CITRATE (PF) 100 MCG/2ML IJ SOLN
INTRAMUSCULAR | Status: DC | PRN
  Administered 2018-12-17: 15 ug via INTRATHECAL

## 2018-12-17 MED ORDER — ACETAMINOPHEN 500 MG PO TABS
ORAL_TABLET | ORAL | Status: AC
  Filled 2018-12-17: qty 2

## 2018-12-17 MED ORDER — ACETAMINOPHEN 500 MG PO TABS
1000.0000 mg | ORAL_TABLET | Freq: Once | ORAL | Status: AC | PRN
  Administered 2018-12-17: 1000 mg via ORAL

## 2018-12-17 SURGICAL SUPPLY — 38 items
BENZOIN TINCTURE PRP APPL 2/3 (GAUZE/BANDAGES/DRESSINGS) ×2 IMPLANT
CHLORAPREP W/TINT 26ML (MISCELLANEOUS) ×3 IMPLANT
CLAMP CORD UMBIL (MISCELLANEOUS) IMPLANT
CLOSURE WOUND 1/2 X4 (GAUZE/BANDAGES/DRESSINGS) ×1
CLOTH BEACON ORANGE TIMEOUT ST (SAFETY) ×3 IMPLANT
DRAPE C SECTION CLR SCREEN (DRAPES) ×3 IMPLANT
DRSG OPSITE POSTOP 4X10 (GAUZE/BANDAGES/DRESSINGS) ×3 IMPLANT
ELECT REM PT RETURN 9FT ADLT (ELECTROSURGICAL) ×3
ELECTRODE REM PT RTRN 9FT ADLT (ELECTROSURGICAL) ×1 IMPLANT
EXTRACTOR VACUUM KIWI (MISCELLANEOUS) IMPLANT
GLOVE BIO SURGEON STRL SZ 6.5 (GLOVE) ×2 IMPLANT
GLOVE BIO SURGEONS STRL SZ 6.5 (GLOVE) ×1
GLOVE BIOGEL PI IND STRL 7.0 (GLOVE) ×2 IMPLANT
GLOVE BIOGEL PI INDICATOR 7.0 (GLOVE) ×4
GOWN STRL REUS W/TWL LRG LVL3 (GOWN DISPOSABLE) ×6 IMPLANT
KIT ABG SYR 3ML LUER SLIP (SYRINGE) IMPLANT
NDL HYPO 25X5/8 SAFETYGLIDE (NEEDLE) IMPLANT
NEEDLE HYPO 25X5/8 SAFETYGLIDE (NEEDLE) IMPLANT
NS IRRIG 1000ML POUR BTL (IV SOLUTION) ×3 IMPLANT
PACK C SECTION WH (CUSTOM PROCEDURE TRAY) ×3 IMPLANT
PAD OB MATERNITY 4.3X12.25 (PERSONAL CARE ITEMS) ×3 IMPLANT
RETRACTOR WND ALEXIS 25 LRG (MISCELLANEOUS) ×1 IMPLANT
RTRCTR C-SECT PINK 25CM LRG (MISCELLANEOUS) IMPLANT
RTRCTR WOUND ALEXIS 25CM LRG (MISCELLANEOUS) ×3
STRIP CLOSURE SKIN 1/2X4 (GAUZE/BANDAGES/DRESSINGS) ×1 IMPLANT
SUT CHROMIC 1 CTX 36 (SUTURE) ×6 IMPLANT
SUT PLAIN 0 NONE (SUTURE) IMPLANT
SUT PLAIN 2 0 XLH (SUTURE) ×3 IMPLANT
SUT VIC AB 0 CT1 27 (SUTURE) ×4
SUT VIC AB 0 CT1 27XBRD ANBCTR (SUTURE) ×2 IMPLANT
SUT VIC AB 2-0 CT1 27 (SUTURE) ×2
SUT VIC AB 2-0 CT1 TAPERPNT 27 (SUTURE) ×1 IMPLANT
SUT VIC AB 3-0 CT1 27 (SUTURE)
SUT VIC AB 3-0 CT1 TAPERPNT 27 (SUTURE) IMPLANT
SUT VIC AB 4-0 KS 27 (SUTURE) ×3 IMPLANT
TOWEL OR 17X24 6PK STRL BLUE (TOWEL DISPOSABLE) ×3 IMPLANT
TRAY FOLEY W/BAG SLVR 14FR LF (SET/KITS/TRAYS/PACK) ×3 IMPLANT
WATER STERILE IRR 1000ML POUR (IV SOLUTION) ×3 IMPLANT

## 2018-12-17 NOTE — Op Note (Signed)
Operative Note    Preoperative Diagnosis: Di/di twin pregnancy at 62 0/7wks                                             Preterm labor                                             Cerclage in place                                              PPROM                                              Baby B transverse lie   Postoperative Diagnosis: Same   Procedure: Primary low transverse cesarean section with double layered closure of uterus, removal of cerclage   Surgeon: Britt Bottom DO  Anesthesia: Spinal  Fluids: LR EBL: UOP:   Findings: Viable infants: baby A in vertex position with body cord, baby B in transverse position with fetal head in maternal left quadrant. Ant placenta baby A. Grossly normal uterus, tubes and ovaries Cervix with cerclage in place with small rent- hemostatic   Specimen: Placenta to pathology   Procedure Note Patient was taken to the operating room where spinal anesthesia was administered and found to be adequate. She was placed in the dorsal supine position with a leftward tilt. A foley was placed  in a sterile manner to drain the bladder.  Pt was prepped and draped in the usual sterile fashion. An appropriate time out was performed. Allis clamp test confirmed adequate anesthesia. A Pfannenstiel skin incision was then made with the scalpel 2 finger breaths above the pubic symphysis and carried through to the underlying layer of fascia by sharp dissection and Bovie cautery. The fascia was nicked in the midline and the incision was extended laterally with Mayo scissors. The superior, then inferior, aspects of the incision were grasped with Coker clamps and dissected off the underlying rectus muscles. Rectus muscles were separated in the midline and the peritoneal cavity entered bluntly. The peritoneal incision was then extended both superiorly and inferiorly with careful attention to avoid both bowel and bladder. The Alexis self-retaining wound  retractor was then placed within the incision and the lower uterine segment exposed. The bladder flap was developed with Metzenbaum scissors and pushed away from the lower uterine segment. The lower uterine segment was then incised in a transverse fashion and the cavity itself entered bluntly. An anterior placenta was encountered.  The vertex of baby A was then delivered from the incision without difficulty.The remainder of the infant delivered easily. A loose body cord was easily unraveled. Bulb suction of mouth and nose was performed.  After a minute delay, the cord was clamped and cut. The infant was handed off to the waiting NICU team. Next, baby B was rotated to vertex and delivered easily. A loose nucal x 1 was reduced. Body easily delivered. After a minute delay, baby B was also handed  off to NICU team. The placentas were then spontaneously expressed from the uterus and the uterus cleared of all clots and debris with moist lap sponge. The uterine incision was then repaired in 2 layers:  the first layer was a running locked layer 0 chromic and the second an imbricating layer of the same suture. The tubes and ovaries were inspected and the gutters cleared of all clots and debris. The uterine incision was inspected again and found to be hemostatic.Copious irrigation was performed.  All instruments and sponges as well as the Alexis retractor were then removed from the abdomen. The  peritoneum was then sutured in a running fashion with the  the rectus muscles incorporated using 2-0 vicryl. Irrigation performed again.  The fascia was then closed with 0 Vicryl in a running fashion. After irrigation, the skin was closed with a subcuticular stitch of 4-0 Vicryl on a Keith needle and then reinforced with benzoin and Steri-Strips. At the conclusion of the procedure all instruments and sponge counts were correct. Patient was taken to the recovery room in good condition Babies were taken to NICU given gestational age

## 2018-12-17 NOTE — Progress Notes (Signed)
Oracit given and CHG bath given. Pt being transported to OR.

## 2018-12-17 NOTE — Anesthesia Procedure Notes (Signed)
Spinal  Patient location during procedure: OB Start time: 12/17/2018 5:44 PM End time: 12/17/2018 5:53 PM Staffing Anesthesiologist: Trevor Iha, MD Performed: anesthesiologist  Preanesthetic Checklist Completed: patient identified, surgical consent, pre-op evaluation, timeout performed, IV checked, risks and benefits discussed and monitors and equipment checked Spinal Block Patient position: right lateral decubitus Prep: site prepped and draped and DuraPrep Patient monitoring: heart rate, cardiac monitor, continuous pulse ox and blood pressure Approach: midline Location: L3-4 Injection technique: single-shot Needle Needle type: Pencan  Needle gauge: 24 G Needle length: 10 cm Needle insertion depth: 5 cm Assessment Sensory level: T4 Additional Notes 3 Attempt (s). Pt tolerated procedure well.

## 2018-12-17 NOTE — Progress Notes (Signed)
Patient ID: Tiera Howman, female   DOB: February 12, 1986, 33 y.o.   MRN: [id number] Pt had some contractions this am while being monitored. She rated them at 3/10; less intense than earlier this past week. She also reported increased LOF.  Pt was given a 500mg  LR bolus and procardia 10mg  po q x 2 and contractions resolved At this time she reports no discomfort. +Fms  VSS - 105/66, afeb EFM : Baby A- 140, moderate variability, reassuring            Baby B- 150, cat 1 TOCO - no contractions now SVE - deferred; cerclage still in place   A/P: Di/di twin pregnancy at 72 0/7wks with cerclage in place, PPROM -                   Stable after hydration and procardia; continue procardia prn       S/P NICU consult       S/P BMZ on 2/23 and 2/24; consider booster if indicated prior to delivery       S/P MgSO4 for CP prophylaxis on 2/25 x 24 hrs       On latency antibx since 2/23; complete on 12/18/18        Plan on delivery at 34 weeks/earlier if labor/infection        Check AFI/BPP 12/18/18 - order placed

## 2018-12-17 NOTE — Transfer of Care (Signed)
Immediate Anesthesia Transfer of Care Note  Patient: Emily Richards  Procedure(s) Performed: CESAREAN SECTION with cerclage removal (N/A Abdomen)  Patient Location: PACU  Anesthesia Type:Spinal  Level of Consciousness: awake, alert  and oriented  Airway & Oxygen Therapy: Patient Spontanous Breathing  Post-op Assessment: Report given to RN and Post -op Vital signs reviewed and stable  Post vital signs: Reviewed and stable  Last Vitals:  Vitals Value Taken Time  BP    Temp    Pulse    Resp    SpO2      Last Pain:  Vitals:   12/17/18 1530  TempSrc:   PainSc: 5       Patients Stated Pain Goal: 2 (12/17/18 1215)  Complications: No apparent anesthesia complications

## 2018-12-17 NOTE — Anesthesia Preprocedure Evaluation (Signed)
Anesthesia Evaluation  Patient identified by MRN, date of birth, ID band Patient awake  General Assessment Comment:Pt ate at 1530 Dr Brennan Bailey callin Code cesarian  Reviewed: Allergy & Precautions, NPO status , Patient's Chart, lab work & pertinent test results  Airway Mallampati: II  TM Distance: >3 FB Neck ROM: Full    Dental no notable dental hx. (+) Teeth Intact   Pulmonary neg pulmonary ROS,    Pulmonary exam normal breath sounds clear to auscultation       Cardiovascular negative cardio ROS Normal cardiovascular exam Rhythm:Regular Rate:Normal     Neuro/Psych negative neurological ROS  negative psych ROS   GI/Hepatic   Endo/Other    Renal/GU      Musculoskeletal   Abdominal   Peds  Hematology SS trait Hgb 10.0 Plt 213   Anesthesia Other Findings   Reproductive/Obstetrics (+) Pregnancy                             Anesthesia Physical Anesthesia Plan  ASA: III and emergent  Anesthesia Plan: Spinal   Post-op Pain Management:    Induction:   PONV Risk Score and Plan: Treatment may vary due to age or medical condition  Airway Management Planned: Nasal Cannula  Additional Equipment:   Intra-op Plan:   Post-operative Plan:   Informed Consent: I have reviewed the patients History and Physical, chart, labs and discussed the procedure including the risks, benefits and alternatives for the proposed anesthesia with the patient or authorized representative who has indicated his/her understanding and acceptance.     Dental advisory given  Plan Discussed with:   Anesthesia Plan Comments: (Twi Gestation w Hx of [place] trait At 32Wks for Stat /CS)        Anesthesia Quick Evaluation

## 2018-12-17 NOTE — Progress Notes (Signed)
Patient ID: Emily Richards, female   DOB: [birth year], 33 y.o.   MRN: [id number] Pt noted to have begun contracting over the past hour. Rating at 4-5/10.  Per [place] baby A still vertex and Baby B transverse Cervix with cerclage still in place 5/90/-1  Discussed with pt given gestational age and fetal lie recommend cesarean per her request.  Urgent C/s called.  Pt last ate at 3pm Plan to remove cerclage after c/s Anesthesia and NICU team advised TO OR

## 2018-12-18 ENCOUNTER — Encounter (HOSPITAL_COMMUNITY): Payer: Self-pay | Admitting: Obstetrics and Gynecology

## 2018-12-18 LAB — CBC
HCT: 27.6 % — ABNORMAL LOW (ref 36.0–46.0)
Hemoglobin: 9 g/dL — ABNORMAL LOW (ref 12.0–15.0)
MCH: 27.6 pg (ref 26.0–34.0)
MCHC: 32.6 g/dL (ref 30.0–36.0)
MCV: 84.7 fL (ref 80.0–100.0)
Platelets: 206 10*3/uL (ref 150–400)
RBC: 3.26 MIL/uL — ABNORMAL LOW (ref 3.87–5.11)
RDW: 14.3 % (ref 11.5–15.5)
WBC: 19.2 10*3/uL — ABNORMAL HIGH (ref 4.0–10.5)
nRBC: 0 % (ref 0.0–0.2)

## 2018-12-18 NOTE — Anesthesia Postprocedure Evaluation (Signed)
Anesthesia Post Note  Patient: Emily Richards  Procedure(s) Performed: CESAREAN SECTION with cerclage removal (N/A Abdomen)     Patient location during evaluation: Mother Baby Anesthesia Type: Spinal Level of consciousness: oriented and awake and alert Pain management: pain level controlled Vital Signs Assessment: post-procedure vital signs reviewed and stable Respiratory status: spontaneous breathing and respiratory function stable Cardiovascular status: blood pressure returned to baseline and stable Postop Assessment: no headache, no backache, no apparent nausea or vomiting and able to ambulate Anesthetic complications: no    Last Vitals:  Vitals:   12/17/18 2226 12/17/18 2252  BP: 112/69 115/75  Pulse: 86 78  Resp:  17  Temp:  36.7 C  SpO2: 98% 94%    Last Pain:  Vitals:   12/17/18 2300  TempSrc:   PainSc: 1    Pain Goal: Patients Stated Pain Goal: 3 (12/17/18 2300)                 Trevor Iha

## 2018-12-18 NOTE — Plan of Care (Signed)
  Problem: Health Behavior/Discharge Planning: Goal: Ability to manage health-related needs will improve Outcome: Progressing   Problem: Clinical Measurements: Goal: Ability to maintain clinical measurements within normal limits will improve Outcome: Progressing   

## 2018-12-18 NOTE — Anesthesia Postprocedure Evaluation (Signed)
Anesthesia Post Note  Patient: Emily Richards  Procedure(s) Performed: CESAREAN SECTION with cerclage removal (N/A Abdomen)     Patient location during evaluation: Mother Baby Anesthesia Type: Spinal Level of consciousness: awake and alert and oriented Pain management: satisfactory to patient Vital Signs Assessment: post-procedure vital signs reviewed and stable Respiratory status: respiratory function stable Cardiovascular status: stable Postop Assessment: no headache, no backache, epidural receding, patient able to bend at knees, no signs of nausea or vomiting and adequate PO intake Anesthetic complications: no    Last Vitals:  Vitals:   12/18/18 0416 12/18/18 0916  BP: 125/63 110/71  Pulse: 74 82  Resp: 17 18  Temp: 36.4 C (!) 36.4 C  SpO2: 97% 100%    Last Pain:  Vitals:   12/18/18 0916  TempSrc: Oral  PainSc:    Pain Goal: Patients Stated Pain Goal: 3 (12/18/18 0857)                 Karleen Dolphin

## 2018-12-18 NOTE — Progress Notes (Signed)
Patient ID: Emily Richards, female   DOB: 09-Sep-1986, 33 y.o.   MRN: [id number] POD#1 Pt doing well. Pain well controlled. No CP/SOB, fever or chills. Pumping. Babies doing well in NICU VSS - 125/63, 74 ABD - FF, dressing in place c/d/i EXT - scds in place, no homans or edema  19.2>9<206  A/P: POD#1 s/p pltc/s at 32 weeks due to preterm labor, PPROM, baby B transverse            - recovering well         Routine pp/post op care

## 2018-12-18 NOTE — Plan of Care (Signed)
  Problem: Education: Goal: Knowledge of condition will improve Outcome: Progressing   

## 2018-12-18 NOTE — Addendum Note (Signed)
Addendum  created 12/18/18 1059 by Graciela Husbands, CRNA   Clinical Note Signed

## 2018-12-18 NOTE — Lactation Note (Signed)
This note was copied from a baby's chart. Lactation Consultation Note  Patient Name: Jasmin Stogdill PTWSF'K Date: 12/18/2018 Reason for consult: Initial assessment;Preterm <34wks;Multiple gestation Twins 15 hours old/32 weeks  Mom breast fed her first two babies for 4 weeks with first and 10 months with second.  Symphony pump was initiated by RN.  Mom has pumped once and obtained 10 mls of colostrum.  She states she remembers how to hand express.  She is waiting for her breakfast and then plans on pumping again.  Discussed what to expect the first few days and milk coming to volume.  Mom has ordered a pump from her insurance company and plans on calling them tomorrow.  Instructed to pump both breasts x 15 minutes every 3 hours followed by hand expression.  Encouraged to call for assist/concerns prn.  Lactation services and Providing Breastmilk For Your Baby in NICU book given to Mom.  Maternal Data Has patient been taught Hand Expression?: Yes Does the patient have breastfeeding experience prior to this delivery?: Yes  Feeding    LATCH Score                   Interventions    Lactation Tools Discussed/Used Pump Review: Setup, frequency, and cleaning;Milk Storage Initiated by:: RN Date initiated:: 12/18/18   Consult Status Consult Status: Follow-up Date: 12/19/18 Follow-up type: In-patient    Huston Foley 12/18/2018, 9:49 AM

## 2018-12-19 DIAGNOSIS — Z3483 Encounter for supervision of other normal pregnancy, third trimester: Secondary | ICD-10-CM | POA: Diagnosis not present

## 2018-12-19 DIAGNOSIS — Z3482 Encounter for supervision of other normal pregnancy, second trimester: Secondary | ICD-10-CM | POA: Diagnosis not present

## 2018-12-19 NOTE — Progress Notes (Signed)
Subjective: Postpartum Day 2: Cesarean Delivery Patient reports incisional pain, tolerating PO and no problems voiding.  Ambulating.  Working on pumping  Objective: Vital signs in last 24 hours: Temp:  [97.5 F (36.4 C)-98.3 F (36.8 C)] 98.3 F (36.8 C) (03/02 0815) Pulse Rate:  [82-91] 89 (03/02 0556) Resp:  [18] 18 (03/02 0815) BP: (106-119)/(67-73) 110/68 (03/02 0556) SpO2:  [98 %-100 %] 100 % (03/02 0815)  Physical Exam:  General: alert and cooperative Lochia: appropriate Uterine Fundus: firm Incision: C/D/I 13533}  Recent Labs    12/17/18 2131 12/18/18 0642  HGB 10.5* 9.0*  HCT 31.7* 27.6*    Assessment/Plan: Status post Cesarean section. Doing well postoperatively.  Continue current care. Babies stable in NICU  Oliver Pila 12/19/2018, 10:06 AM

## 2018-12-19 NOTE — Lactation Note (Signed)
This note was copied from a baby's chart. Lactation Consultation Note  Patient Name: Emily Richards LOVFI'E Date: 12/19/2018 Reason for consult: Follow-up assessment;NICU baby;Multiple gestation;Infant < 6lbs;Preterm <34wks  Visited with Mom of 32 week twins in the NICU.  Mom had been feeling poorly, but is much better today.  Mom hasn't pumped as much as she would like, but plans to increase frequency to > 8 times per day.  Encouraged STS in the NICU, pumping at the bedside, and breast massage and hand expression.   Mom to call insurance carrier about her pump.   Possible discharge tomorrow or Wednesday.    Interventions Interventions: Breast feeding basics reviewed;Skin to skin;Breast massage;Hand express;DEBP;Coconut oil  Lactation Tools Discussed/Used Tools: Pump;Coconut oil Breast pump type: Double-Electric Breast Pump   Consult Status Consult Status: Follow-up Date: 12/20/18 Follow-up type: In-patient    Judee Clara 12/19/2018, 9:56 AM

## 2018-12-19 NOTE — Addendum Note (Signed)
Addendum  created 12/19/18 1144 by Trevor Iha, MD   Intraprocedure Staff edited

## 2018-12-20 NOTE — Progress Notes (Signed)
POD #3 Doing ok, abdomen sore, babies stable in NICU, getting more milk with pumping Afeb, VSS Abd- soft, pressure dressing still in place Continue routine care, remove pressure dressing, encouraged ambulation

## 2018-12-20 NOTE — Lactation Note (Signed)
This note was copied from a baby's chart. Lactation Consultation Note  Patient Name: Taniesha Glickstein EWYBR'K Date: 12/20/2018  Follow up visit attempted but mom is sleeping.   Maternal Data    Feeding    LATCH Score                   Interventions    Lactation Tools Discussed/Used     Consult Status      Huston Foley 12/20/2018, 3:10 PM

## 2018-12-20 NOTE — Progress Notes (Signed)
Visited with Torrie and her husband this morning.  When I entered the room, Babita was in the bathroom and I had an opportunity to chat with her husband alone.  He reports the babies are doing well.  He hasn't held them yet but was with her when she held baby A for the first time.  Gloriann shared that baby A is so small, but sh ewas glad to hold him and had been able to make several visits to see the boys since our last one yesterday and she is pumping a lot of milk.  She is going to be discharged tomorrow and has some anxiety about that, but is looking forward to seeing her three year old, Halima.  Please page as further needs arise.  [name]. Carley Hammed, M.Div. [facility] Chaplain Pager [phone] Office [phone]

## 2018-12-20 NOTE — Progress Notes (Signed)
Attempted to visit pt at her babies' bedsides and again at her room.  Pt RN informed me that pt had been frustrated by not being able to see her babies' nurses when she went up to see them.  I found pt when she returned to her room with her relatives.  She was on the phone with FOB, her voice quivering as she told him about her visit with the boys.  I spent some time talking with Rochell about her unexpected delivery on Saturday.  She is in good spirits about the delivery and glad her babies are stable, but experiencing a lot of pain.  She shared that she's trying not to visit the boys too much because she wants to let the nurses do their jobs and "they're just so small".  She went up to the NICU twice in hopes of finally getting to hold them and do some skin to skin as recommended by the [facility].  PT shared she was unable to find her boys' nurse and was told by someone else that they must all be at lunch.  Pt was upset that the babies were left without any nursing care.  I explained that the nurses never take a break without someone else watching them and that it was likely that her sons' nurse was in another pt room.  I also informed her that when I had been by to visit right before coming to her room, I'd seen a nurse in with baby A.  I asked Anelise if she'd like me to go with her to the NICU to try again.    When we arrived baby A, Emron, had his nurse with him providing care.  I notified him that Jalilah was eager to hold her baby for the first time.  He explained that he was hanging lines at the moment, but another RN walked by at that time and she was able to get baby B, Yunus.  Pt expressed joy at holding her son for the first time and I took some photos for her.  She was reluctant to pick him up while he was sleeping and expressed some concern about baby A's size.  I assured her that the nurses will let her know when it's not okay to hold them and would discourage her if they needed to rest.  Pt  expressed gratitude for the escort to her sons' rooms and the support in getting what she needed.  Will continue to follow.  Please page as further needs arise.  [name]. Carley Hammed, M.Div. [facility] Chaplain Pager [phone] Office [phone]

## 2018-12-21 MED ORDER — OXYCODONE HCL 5 MG PO TABS: 5.0000 mg | ORAL_TABLET | Freq: Four times a day (QID) | ORAL | 0 refills | Status: DC | PRN

## 2018-12-21 MED ORDER — PRENATAL MULTIVITAMIN CH: 1.0000 | ORAL_TABLET | Freq: Every day | ORAL | 3 refills | Status: DC

## 2018-12-21 MED ORDER — IBUPROFEN 800 MG PO TABS: 800.0000 mg | ORAL_TABLET | Freq: Three times a day (TID) | ORAL | 3 refills | Status: DC

## 2018-12-21 NOTE — Progress Notes (Signed)
Subjective: Postpartum Day 4: Cesarean Delivery Patient reports incisional pain and tolerating PO.    Objective: Vital signs in last 24 hours: Temp:  [98.1 F (36.7 C)-98.4 F (36.9 C)] 98.4 F (36.9 C) (03/04 0409) Pulse Rate:  [96-104] 104 (03/04 0409) Resp:  [16-18] 16 (03/04 0409) BP: (109-136)/(63-85) 136/85 (03/04 0409) SpO2:  [98 %-100 %] 100 % (03/04 0409)  Physical Exam:  General: alert and no distress Lochia: appropriate Uterine Fundus: firm Incision: healing well DVT Evaluation: No evidence of DVT seen on physical exam.  No results for input(s): HGB, HCT in the last 72 hours.  Assessment/Plan: Status post Cesarean section. Doing well postoperatively.  Discharge home with standard precautions and return to clinic in 2 and 6 weeks.  D/C with Motrin, oxycodone and PNV.    Babies in NICU doing well RA  Roshelle Traub Bovard-Stuckert 12/21/2018, 7:59 AM

## 2018-12-21 NOTE — Progress Notes (Signed)
Discharge instructions given to patient, Baby and me book reviewed, PP care given, medications discussed with patient and pt verbalized understanding

## 2018-12-21 NOTE — Progress Notes (Signed)
Patient and personal things are noted to be gone from room. PC to patient and she had already left and was at home. Pt discharged from computer.

## 2018-12-21 NOTE — Lactation Note (Signed)
This note was copied from a baby's chart. Lactation Consultation Note  Patient Name: Emily Richards FIEPP'I Date: 12/21/2018 Reason for consult: Follow-up assessment;NICU baby;Multiple gestation;Preterm <34wks  Visited with [name] of 32 wk twins in the NICU. Mom to be discharged today.  Mom has been double pumping every 3 hrs, obtaining 3-4 oz per pumping.  Breasts are soft and compressible.  Engorgement prevention and treatment reviewed. Mom has a DEBP at home (from her insurance company).  Reviewed importance of bringing pump parts with her to NICU when she is staying with her babies, so she can pump at the bedside.  Mom aware of OP lactation support available to her, and encouraged to call for any concerns.     Interventions Interventions: Breast feeding basics reviewed;Skin to skin;Breast massage;Hand express;DEBP;Coconut oil  Lactation Tools Discussed/Used Tools: Pump;Coconut oil Breast pump type: Double-Electric Breast Pump   Consult Status Consult Status: Complete Date: 12/21/18 Follow-up type: Call as needed    Judee Clara 12/21/2018, 9:30 AM

## 2018-12-21 NOTE — Discharge Summary (Signed)
OB Discharge Summary     Patient Name: Emily Richards DOB: 07-07-1986 MRN: [id number]  Date of admission: 12/11/2018 Delivering MD:    Janann August Rudean [[id number]]  Reynolds, CECILIA [facility]   Quintin, BoyB Tanda [[id number]]  Pryor Ochoa [facility]   Date of discharge: 12/21/2018  Admitting diagnosis: 31wks, twins, ruptured with cerclage Intrauterine pregnancy: [redacted]w[redacted]d     Secondary diagnosis:  Active Problems:   Preterm premature rupture of membranes (PPROM) with unknown onset of labor   Multiple birth (>2) liveborn, mates liveborn, by cesarean   Postpartum care following cesarean delivery  Additional problems: removal of cerclage     Discharge diagnosis: Preterm Pregnancy Delivered and twin pregnancy                                                                                                Post partum procedures:N/A  Augmentation: N/A  Complications: ROM>24 hours  Hospital course:  Onset of Labor With Unplanned C/S  33 y.o. yo [id number] at [redacted]w[redacted]d was admitted in Active Labor on 12/11/2018. Patient had a labor course significant for PPROM. Membrane Rupture Time/Date:    Karlena, Gilfillan [[id number]]  6:03 PM   Tymeisha, Dumoulin [[id number]]  6:05 PM ,   Tammara, Faris [[id number]]  12/17/2018   Adeleigh, Hosaka [[id number]]  12/17/2018   The patient went for cesarean section due to [facility], and delivered a Viable Thornell Sartorius   Sheridyn, Satkowski [[id number]]  12/17/2018   Valynda, Deshazo [[id number]]  12/17/2018  Details of operation can be found in separate operative note. Patient had an uncomplicated postpartum course.  She is ambulating,tolerating a regular diet, passing flatus, and urinating well.  Patient is discharged home in stable condition 12/21/18.  Physical exam  Vitals:   12/20/18 1551 12/20/18 1940 12/21/18 0409 12/21/18 0750  BP: 127/71 109/63 136/85 115/69  Pulse: (!) 104 96  (!) 104 (!) 105  Resp: 18 17 16 18   Temp: 98.1 F (36.7 C) 98.3 F (36.8 C) 98.4 F (36.9 C) 98.4 F (36.9 C)  TempSrc: Oral Oral Oral Oral  SpO2: 98% 98% 100% 96%  Weight:      Height:       General: alert and no distress Lochia: appropriate Uterine Fundus: firm Incision: Healing well with no significant drainage DVT Evaluation: No evidence of DVT seen on physical exam. Labs: Lab Results  Component Value Date   WBC 19.2 (H) 12/18/2018   HGB 9.0 (L) 12/18/2018   HCT 27.6 (L) 12/18/2018   MCV 84.7 12/18/2018   PLT 206 12/18/2018   No flowsheet data found.  Discharge instruction: per After Visit Summary and "Baby and Me Booklet".  After visit meds:  Allergies as of 12/21/2018   No Known Allergies     Medication List    STOP taking these medications   progesterone 200 MG capsule Commonly known as:  PROMETRIUM     TAKE these medications   acetaminophen 500 MG tablet Commonly known as:  TYLENOL Take 1,000 mg by mouth daily as needed for headache.   alum & mag hydroxide-simeth 200-200-20 MG/5ML  suspension Commonly known as:  MAALOX/MYLANTA Take 15 mLs by mouth as needed for indigestion or heartburn.   ibuprofen 800 MG tablet Commonly known as:  ADVIL,MOTRIN Take 1 tablet (800 mg total) by mouth every 8 (eight) hours.   loperamide 2 MG capsule Commonly known as:  IMODIUM Take 2 mg by mouth once.   oxyCODONE 5 MG immediate release tablet Commonly known as:  Oxy IR/ROXICODONE Take 1-2 tablets (5-10 mg total) by mouth every 6 (six) hours as needed for moderate pain or severe pain.   prenatal multivitamin Tabs tablet Take 1 tablet by mouth daily at 12 noon. What changed:  when to take this       Diet: routine diet  Activity: Advance as tolerated. Pelvic rest for 6 weeks.   Outpatient follow up:2 and 6 weeks Follow up Appt:No future appointments. Follow up Visit:No follow-ups on file.  Postpartum contraception: Undecided  Newborn Data:   Emiko, Tallerico [[id number]]  Live born female  Birth Weight: 3 lb 3.2 oz (1450 g) APGAR: 8, 9  Newborn Delivery   Birth date/time:  12/17/2018 18:04:00 Delivery type:  C-Section, Low Transverse Trial of labor:  No C-section categorization:  Primary      Yasuri, Pomerenke [[id number]]  Live born female  Birth Weight: 4 lb 1.6 oz (1859 g) APGAR: 6, 8  Newborn Delivery   Birth date/time:  12/17/2018 18:06:00 Delivery type:  C-Section, Low Transverse Trial of labor:  No C-section categorization:  Primary     Baby Feeding: Breast by NICU Disposition:NICU   12/21/2018 Sherian Rein, MD

## 2018-12-26 DIAGNOSIS — O9989 Other specified diseases and conditions complicating pregnancy, childbirth and the puerperium: Secondary | ICD-10-CM | POA: Diagnosis not present

## 2019-01-17 ENCOUNTER — Ambulatory Visit: Payer: Self-pay

## 2019-01-17 NOTE — Lactation Note (Signed)
This note was copied from a baby's chart. Lactation Consultation Note  Patient Name: Emily Richards KXFGH'W Date: 01/17/2019    [facility] Consult:  RN requested assistance:  Mother interested in renting a [facility] pump  After discussing her options, mother has decided to go to the [facility] office to pick up a DEBP curbside.  She already has a [employer] pump that she received from her insurance company and a manual pump from the hospital.  RN updated.                   Marvette Schamp R Jahzeel Poythress 01/17/2019, 11:14 AM

## 2019-01-20 DIAGNOSIS — Z3482 Encounter for supervision of other normal pregnancy, second trimester: Secondary | ICD-10-CM | POA: Diagnosis not present

## 2019-01-20 DIAGNOSIS — Z3483 Encounter for supervision of other normal pregnancy, third trimester: Secondary | ICD-10-CM | POA: Diagnosis not present

## 2019-02-03 DIAGNOSIS — Z30430 Encounter for insertion of intrauterine contraceptive device: Secondary | ICD-10-CM | POA: Diagnosis not present

## 2020-01-01 IMAGING — US US MFM OB COMP ADDL GEST +14 WKS
1 series · 15 of 28 positions shown · non-contrast
Comparison: none

[Series 1: us mfm ob comp addl gest +14 wks · 15 of 143 slices shown]
[im 1/143]
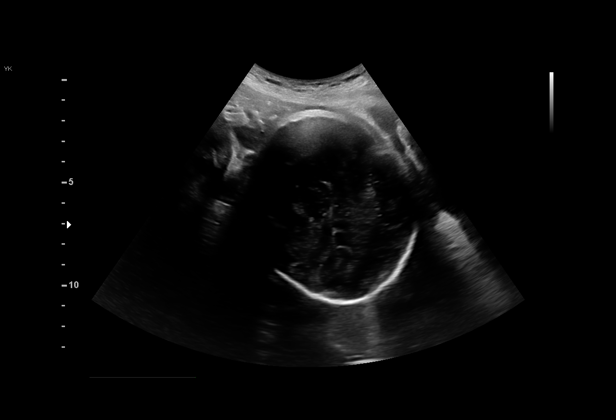
[im 11/143]
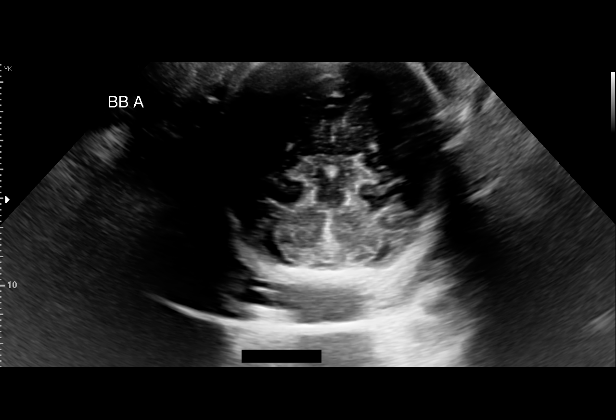
[im 22/143]
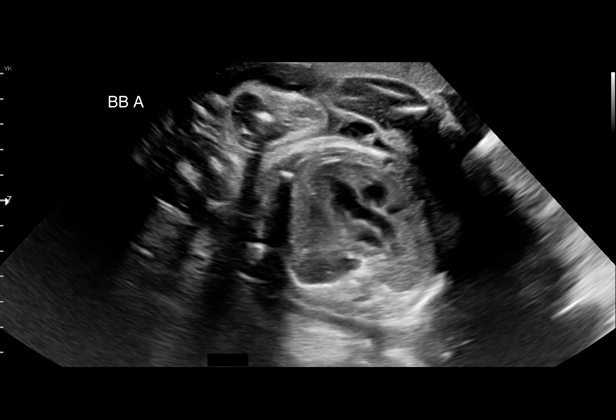
[im 32/143]
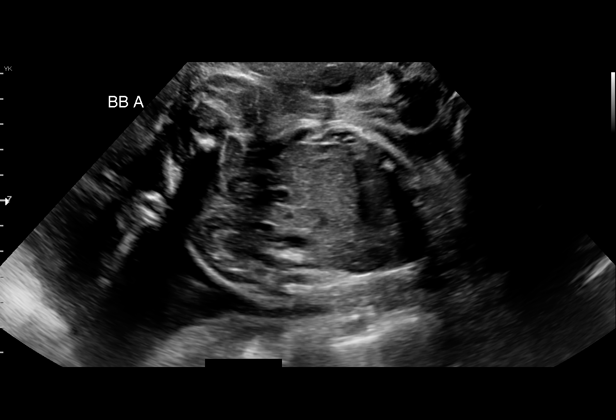
[im 43/143]
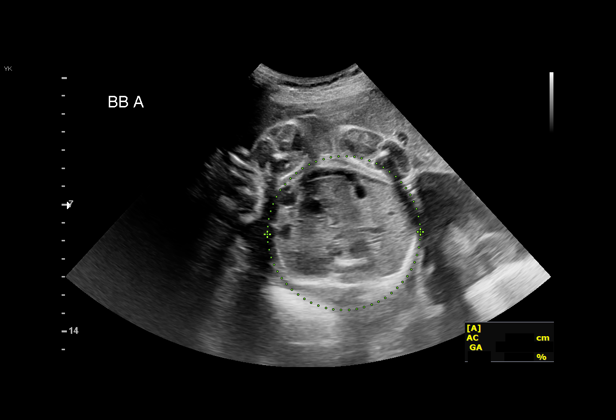
[im 53/143]
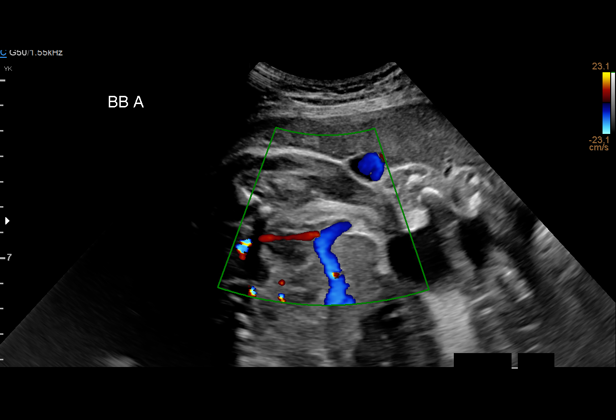
[im 64/143]
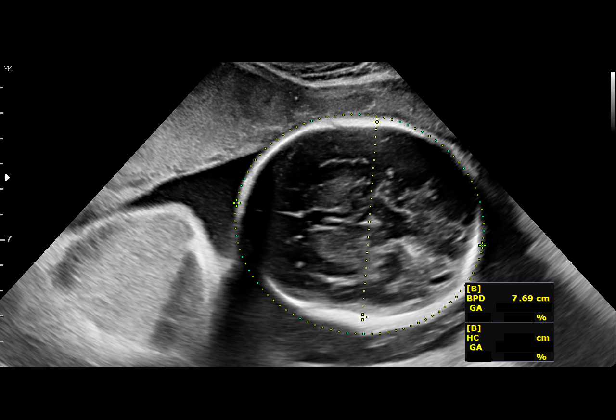
[im 74/143]
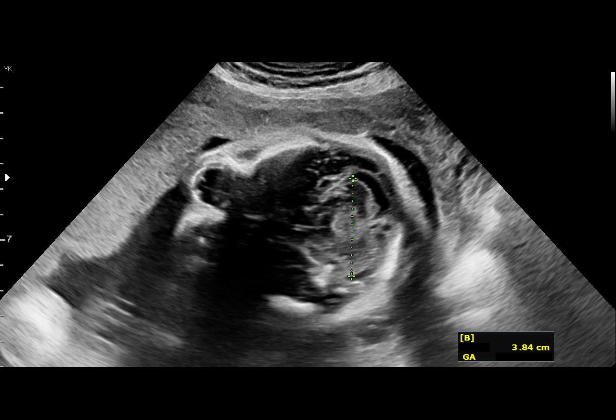
[im 79/143]
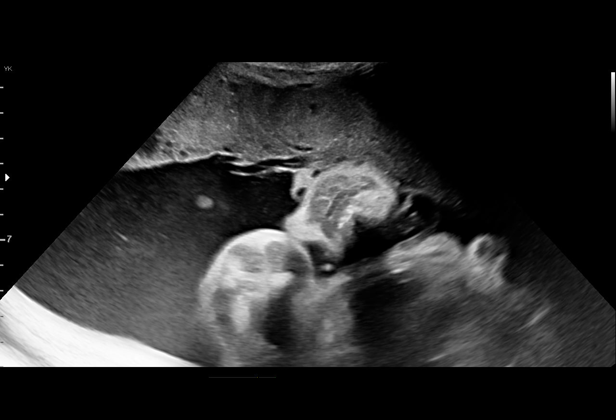
[im 90/143]
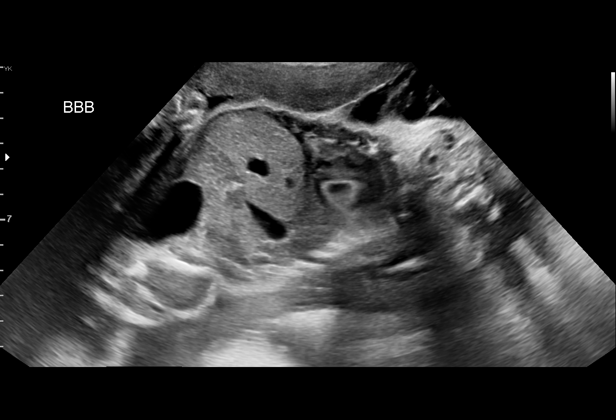
[im 100/143]
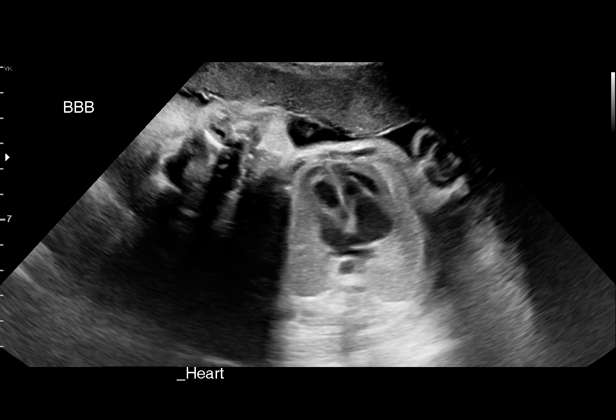
[im 111/143]
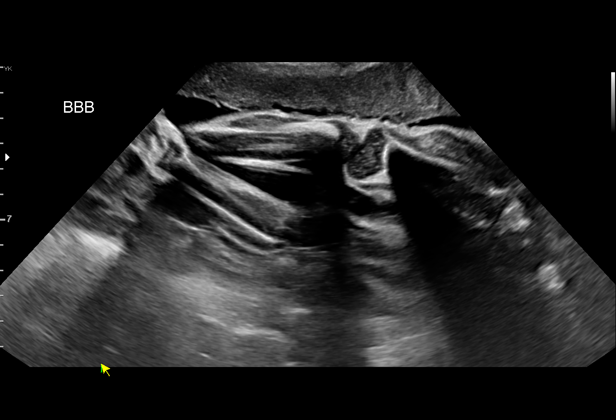
[im 121/143]
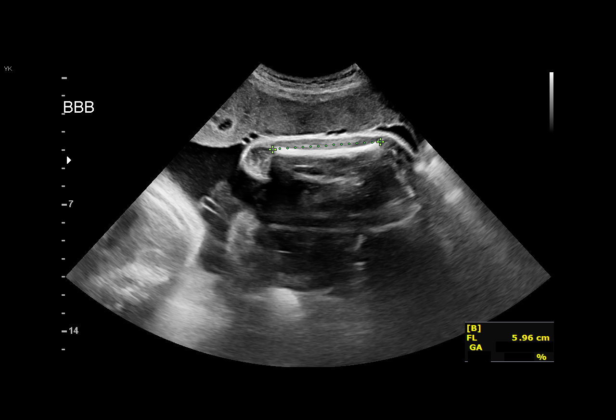
[im 132/143]
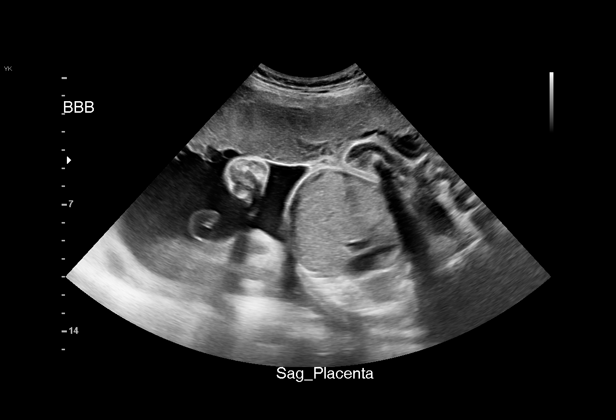
[im 143/143]
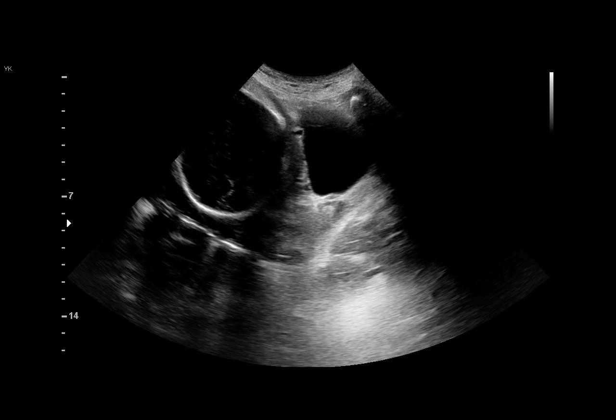

[15 of 28 positions shown; findings below may reference images not displayed]

OBGYN
                                                             [REDACTED]
 Referred By:      Sentia Jonh

  1  US MFM OB COMP + 14 WK               76805.01     JINGANG HOUSEWARE
                                                       JINGANG HOUSEWARE
  2  US MFM OB COMP ADDL GEST             76810.01     [place]
     + 14 WK                                           JINGANG HOUSEWARE
 ----------------------------------------------------------------------

 ----------------------------------------------------------------------
Indications

  Twin pregnancy, di/di, third trimester
  History of cervical cerclage, currently
  pregnant
  Premature rupture of membranes - leaking
  fluid
  31 weeks gestation of pregnancy
 ----------------------------------------------------------------------
Fetal Evaluation (Fetus A)

 Num Of Fetuses:          2
 Fetal Heart Rate(bpm):   138
 Cardiac Activity:        Observed
 Presentation:            Cephalic
 Placenta:                Anterior
 Membrane Desc:      Dividing Membrane seen

 Amniotic Fluid
 AFI FV:      Within normal limits

                             Largest Pocket(cm)

Biometry (Fetus A)

 BPD:      77.9  mm     G. Age:  31w 2d         43  %    CI:        74.12   %    70 - 86
                                                         FL/HC:       19.3  %    19.3 -
 HC:      287.3  mm     G. Age:  31w 4d         25  %    HC/AC:       1.07       0.96 -
 AC:      268.5  mm     G. Age:  31w 0d         41  %    FL/BPD:      71.1  %    71 - 87
 FL:       55.4  mm     G. Age:  29w 1d          3  %    FL/AC:       20.6  %    20 - 24

 Est. FW:    1685   gm     3 lb 8 oz     42  %     FW Discordancy        11  %
OB History

 Gravidity:    3         Term:   2
 Living:       2
Gestational Age (Fetus A)

 LMP:           31w 1d        Date:  05/07/18                 EDD:   02/11/19
 Clinical EDD:  31w 1d                                        EDD:   02/11/19
 U/S Today:     30w 5d                                        EDD:   02/14/19
 Best:          31w 1d     Det. By:  LMP  (05/07/18)          EDD:   02/11/19
Anatomy (Fetus A)

 Cranium:               Appears normal         Aortic Arch:            Not well visualized
 Cavum:                 Appears normal         Ductal Arch:            Not well visualized
 Ventricles:            Appears normal         Diaphragm:              Appears normal
 Choroid Plexus:        Appears normal         Stomach:                Appears normal, left
                                                                       sided
 Cerebellum:            Appears normal         Abdomen:                Appears normal
 Posterior Fossa:       Appears normal         Abdominal Wall:         Appears nml (cord
                                                                       insert, abd wall)
 Face:                  Appears normal         Cord Vessels:           Appears normal (3
                        (orbits and profile)                           vessel cord)
 Lips:                  Not well visualized    Kidneys:                Appear normal
 Palate:                Not well visualized    Bladder:                Appears normal
 Thoracic:              Appears normal         Spine:                  Ltd views no
                                                                       intracranial signs of
                                                                       NTD
 Heart:                 Appears normal         Upper Extremities:      Visualized
                        (4CH, axis, and
                        situs)
 RVOT:                  Appears normal         Lower Extremities:      Visualized
 LVOT:                  Appears normal

 Other:  Technically difficult due to advanced GA and fetal position.

Fetal Evaluation (Fetus B)

 Num Of Fetuses:          2
 Fetal Heart Rate(bpm):   157
 Cardiac Activity:        Observed
 Presentation:            Transverse, head to maternal left
 Placenta:                Anterior

 Amniotic Fluid
 AFI FV:      Within normal limits

                             Largest Pocket(cm)

Biometry (Fetus B)

 BPD:      77.5  mm     G. Age:  31w 1d         37  %    CI:        69.96   %    70 - 86
                                                         FL/HC:       20.2  %    19.3 -
 HC:      295.6  mm     G. Age:  32w 5d         56  %    HC/AC:       1.08       0.96 -
 AC:      274.9  mm     G. Age:  31w 4d         59  %    FL/BPD:      77.0  %    71 - 87
 FL:       59.7  mm     G. Age:  31w 1d         35  %    FL/AC:       21.7  %    20 - 24
 CER:      38.4  mm     G. Age:  32w 5d         75  %

 CM:          5  mm

 Est. FW:    1666   gm   3 lb 15 oz      62  %     FW Discordancy     0 \ 11 %
Gestational Age (Fetus B)

 LMP:           31w 1d        Date:  05/07/18                 EDD:   02/11/19
 Clinical EDD:  31w 1d                                        EDD:   02/11/19
 U/S Today:     31w 5d                                        EDD:   02/07/19
 Best:          31w 1d     Det. By:  LMP  (05/07/18)          EDD:   02/11/19
Anatomy (Fetus B)

 Cranium:               Appears normal         Aortic Arch:            Appears normal
 Cavum:                 Appears normal         Ductal Arch:            Not well visualized
 Ventricles:            Appears normal         Diaphragm:              Appears normal
 Choroid Plexus:        Appears normal         Stomach:                Appears normal, left
                                                                       sided
 Cerebellum:            Appears normal         Abdomen:                Appears normal
 Posterior Fossa:       Appears normal         Abdominal Wall:         Appears nml (cord
                                                                       insert, abd wall)
 Face:                  Appears normal         Cord Vessels:           Appears normal (3
                        (orbits and profile)                           vessel cord)
 Lips:                  Appears normal         Kidneys:                Appear normal
 Palate:                Previously seen        Bladder:                Appears normal
 Thoracic:              Appears normal         Spine:                  Ltd views no
                                                                       intracranial signs of
                                                                       NTD
 Heart:                 Appears normal         Upper Extremities:      Visualized
                        (4CH, axis, and
                        situs)
 RVOT:                  Appears normal         Lower Extremities:      Visualized
 LVOT:                  Appears normal
Cervix Uterus Adnexa

 Cervix
 Length:           3.64  cm.
 Normal appearance by transabdominal scan.
Impression

 Patient with twin pregnancy is admitted with the diagnosis of
 PPROM. She had cerclage in this pregnancy.
 Dichorionic-diamniotic twin pregnancy.
 Twin A: Lower fetus, cephalic presentation, anterior placenta.
 Amniotic fluid is normal and good fetal activity is seen. Fetal
 growth is appropriate for gestational age. Fetal anatomy
 appears normal, but limited by advanced gestational age.
 Twin B: Upper fetus, transverse lie and head to maternal left,
 anterior placenta. Amniotic fluid is normal and good fetal
 activity is seen. Fetal growth is appropriate for gestational
 age. Fetal anatomy appears normal, but limited by advanced
 gestational age.
 Growth discordancy: 11% (normal).
 On transabdominal scan, the cervix looks long and close. I
 cannot comment on the cerclage.

 Diagnosis of PPROM is more certain on clinical examination
 than on the presence or absence of amniotic fluid on
 ultrasound.
 If PPROM is confirmed, I recommend removal of cerclage at
 32 weeks provided there is no evidence of infection.
                 [place]

## 2020-12-12 ENCOUNTER — Other Ambulatory Visit (INDEPENDENT_AMBULATORY_CARE_PROVIDER_SITE_OTHER): Payer: Self-pay | Admitting: Primary Care

## 2022-03-26 ENCOUNTER — Encounter (HOSPITAL_BASED_OUTPATIENT_CLINIC_OR_DEPARTMENT_OTHER): Payer: Self-pay | Admitting: Cardiology

## 2022-03-26 ENCOUNTER — Ambulatory Visit (HOSPITAL_BASED_OUTPATIENT_CLINIC_OR_DEPARTMENT_OTHER): Payer: Managed Care, Other (non HMO) | Admitting: Cardiology

## 2022-03-26 VITALS — BP 127/91 | HR 92 | Ht 65.0 in | Wt 133.0 lb

## 2022-03-26 DIAGNOSIS — Z7189 Other specified counseling: Secondary | ICD-10-CM | POA: Diagnosis not present

## 2022-03-26 DIAGNOSIS — R079 Chest pain, unspecified: Secondary | ICD-10-CM

## 2022-03-26 NOTE — Patient Instructions (Signed)
Medication Instructions:  Your Physician recommend you continue on your current medication as directed.    *If you need a refill on your cardiac medications before your next appointment, please call your pharmacy*   Lab Work: None ordered today   Testing/Procedures: Your physician has requested that you have an echocardiogram. Echocardiography is a painless test that uses sound waves to create images of your heart. It provides your doctor with information about the size and shape of your heart and how well your heart's chambers and valves are working. This procedure takes approximately one hour. There are no restrictions for this procedure. [place], you and your health needs are our priority.  As part of our continuing mission to provide you with exceptional heart care, we have created designated Provider Care Teams.  These Care Teams include your primary Cardiologist (physician) and Advanced Practice Providers (APPs -  Physician Assistants and Nurse Practitioners) who all work together to provide you with the care you need, when you need it.  We recommend signing up for the patient portal called "MyChart".  Sign up information is provided on this After Visit Summary.  MyChart is used to connect with patients for Virtual Visits (Telemedicine).  Patients are able to view lab/test results, encounter notes, upcoming appointments, etc.  Non-urgent messages can be sent to your provider as well.   To learn more about what you can do with MyChart, go to [email or web address].    Your next appointment:   Call us if you need [place]!    Important Information About Sugar

## 2022-03-26 NOTE — Progress Notes (Signed)
Cardiology Office Note:    Date:  03/26/2022   ID:  Emily Richards, DOB 11-16-1985, MRN [id number]  PCP:  Rometta Emery, MD  Cardiologist:  Jodelle Red, MD  Referring MD: Rometta Emery, MD   CC: new patient consult for chest pressure/tightness  History of Present Illness:    Emily Richards is a 36 y.o. female with a hx of hyperlipidemia, GERD, postpartum hemorrhage, who is seen as a new consult at the request of Rometta Emery, MD for the evaluation and management of chest pressure and tightness.  Referral notes from Dr. Mikeal Hawthorne personally reviewed. At their visit 03/23/2022 she complained of central chest pressure with onset 1 week prior. She was referred to cardiology for further evaluation.  Chest pain: -Initial onset: About 2 weeks ago, Sunday before Memorial day. -Quality: Initially a pressure. After the chest pressure resolves, it progresses to strange pinprick sensations throughout her upper chest and back. -Frequency: Lately her episodes occur daily, usually at night but sometimes during the day. At the time of her episodes she is often lying in bed before falling asleep.    -Duration: Typically 5-10 minutes before resolving, and then may recur. -Associated symptoms:  -Aggravating/alleviating factors: Calms with lying on her side but is still present. Breathing deeply has helped, which causes her to wonder if she was having a panic attack. She denies tenderness on palpation. -Prior cardiac history: none -Prior workup: none -Prior treatment: During her initial episode, she took 4 tablets of ASA 81 mg, and about an hour later she felt dizzy and was leaning towards her left side. Since then she has not taken ASA for her episodes. -Alcohol: None. -Tobacco: Never smoker. -Comorbidities: Hyperlipidemia, GERD, -Exercise level: She is able to push through her chest discomfort. Stays active with caring for her children.   -Cardiac ROS: no shortness of breath,  no PND, no orthopnea, no LE edema, no syncope -Family history:  Her paternal grandmother has a pacemaker.  -Diet: Lately she has been trying to work towards a healthier diet. Tried tofu recently.  Her daughter was recently found to have skipped beats, so she obtained a stethoscope. She has listened to her own heart and noticed slow heart beats on her right side.  In the Winter she has recurring lesions/marks on the sides of her feet. Initially they were erythematous. She also endorses intermittent LE edema when on her feet for long periods. She is currently managing with compression socks.  She denies any shortness of breath. No headaches, syncope, orthopnea, or PND.  Past Medical History:  Diagnosis Date   Multiple birth (>2) liveborn, mates liveborn, by cesarean 12/17/2018   Postpartum hemorrhage     Past Surgical History:  Procedure Laterality Date   CERVICAL CERCLAGE N/A 09/22/2018   Procedure: CERCLAGE CERVICAL;  Surgeon: Huel Cote, MD;  Location: [facility];  Service: Gynecology;  Laterality: N/A;   CESAREAN SECTION N/A 12/17/2018   Procedure: CESAREAN SECTION with cerclage removal;  Surgeon: Edwinna Areola, DO;  Location: [facility] LD ORS;  Service: Obstetrics;  Laterality: N/A;   WISDOM TOOTH EXTRACTION      Current Medications: Current Outpatient Medications on File Prior to Visit  Medication Sig   diphenhydrAMINE (BENADRYL ALLERGY) 25 mg capsule Benadryl  AS NEEDED FOR ALLERGIES   levonorgestrel (MIRENA, 52 MG,) 20 MCG/DAY IUD Mirena 21 mcg/24 hours (8 yrs) 52 mg intrauterine device  Take 1 device every day by intrauterine route.   spironolactone (ALDACTONE) 50 MG tablet 1  tablet   tretinoin (RETIN-A) 0.025 % cream 1 application in the evening to face   triamcinolone ointment (KENALOG) 0.1 % triamcinolone acetonide 0.1 % topical ointment  APPLY TO AFFECTED AREA TWICE A DAY FOR 30 DAYS   No current facility-administered medications on file prior to visit.      Allergies:   Patient has no known allergies.   Social History   Tobacco Use   Smoking status: Never   Smokeless tobacco: Never  Vaping Use   Vaping Use: Never used  Substance Use Topics   Alcohol use: No   Drug use: No    Family History: family history includes Heart disease in her paternal grandmother; Other in her father.  ROS:   Please see the history of present illness.  Additional pertinent ROS: Constitutional: Negative for chills, fever, night sweats, unintentional weight loss  HENT: Negative for ear pain and hearing loss.   Eyes: Negative for loss of vision and eye pain.  Respiratory: Negative for cough, sputum, wheezing.   Cardiovascular: See HPI. Gastrointestinal: Negative for abdominal pain, melena, and hematochezia.  Genitourinary: Negative for dysuria and hematuria.  Musculoskeletal: Negative for falls and myalgias.  Skin: Negative for itching and rash.  Neurological: Negative for focal weakness, focal sensory changes and loss of consciousness.  Endo/Heme/Allergies: Does not bruise/bleed easily.     EKGs/Labs/Other Studies Reviewed:    The following studies were reviewed today:  No prior cardiovascular studies available.   EKG:  EKG is personally reviewed.   03/26/2022: NSR at 92 bpm with nonspecific t wave pattern  Recent Labs: No results found for requested labs within last 365 days.   Recent Lipid Panel No results found for: "CHOL", "TRIG", "HDL", "CHOLHDL", "VLDL", "LDLCALC", "LDLDIRECT"  Physical Exam:    VS:  BP (!) 127/91   Pulse 92   Ht 5\' 5"  (1.651 m)   Wt 133 lb (60.3 kg)   SpO2 100%   BMI 22.13 kg/m     Wt Readings from Last 3 Encounters:  03/26/22 133 lb (60.3 kg)  12/18/18 127 lb 0.8 oz (57.6 kg)  09/22/18 126 lb 8 oz (57.4 kg)    GEN: Well nourished, well developed in no acute distress HEENT: Normal, moist mucous membranes NECK: No JVD CARDIAC: regular rhythm, normal S1 and S2, no rubs or gallops. No murmur. VASCULAR:  Radial and DP pulses 2+ bilaterally. No carotid bruits RESPIRATORY:  Clear to auscultation without rales, wheezing or rhonchi  ABDOMEN: Soft, non-tender, non-distended MUSCULOSKELETAL:  Ambulates independently SKIN: Warm and dry, no edema NEUROLOGIC:  Alert and oriented x 3. No focal neuro deficits noted. PSYCHIATRIC:  Normal affect    ASSESSMENT:    1. Chest pain of uncertain etiology   2. Cardiac risk counseling   3. Counseling on health promotion and disease prevention    PLAN:    Chest pain/pressure -atypical in nature, no high risk features -no family history of premature CAD -will obtain echo as pain is nonexertional -if echo reassuring, suspect noncardiac etiology  Cardiac risk counseling and prevention recommendations: -recommend heart healthy/Mediterranean diet, with whole grains, fruits, vegetable, fish, lean meats, nuts, and olive oil. Limit salt. -recommend moderate walking, 3-5 times/week for 30-50 minutes each session. Aim for at least 150 minutes.week. Goal should be pace of 3 miles/hours, or walking 1.5 miles in 30 minutes -recommend avoidance of tobacco products. Avoid excess alcohol. -ASCVD risk score: The ASCVD Risk score (Arnett DK, et al., 2019) failed to calculate for the following reasons:  The 2019 ASCVD risk score is only valid for ages 3740 to 2379    Plan for follow up: PRN.  Jodelle RedBridgette Tyiana Hill, MD, PhD, [facility] [place]  [facility]    Medication Adjustments/Labs and Tests Ordered: Current medicines are reviewed at length with the patient today.  Concerns regarding medicines are outlined above.   Orders Placed This Encounter  Procedures   EKG 12-Lead   ECHOCARDIOGRAM COMPLETE   No orders of the defined types were placed in this encounter.  Patient Instructions  Medication Instructions:  Your Physician recommend you continue on your current medication as directed.    *If you need a refill on your cardiac medications before your next  appointment, please call your pharmacy*   Lab Work: None ordered today   Testing/Procedures: Your physician has requested that you have an echocardiogram. Echocardiography is a painless test that uses sound waves to create images of your heart. It provides your doctor with information about the size and shape of your heart and how well your heart's chambers and valves are working. This procedure takes approximately one hour. There are no restrictions for this procedure. [address]   Follow-Up: [facility], you and your health needs are our priority.  As part of our continuing mission to provide you with exceptional heart care, we have created designated Provider Care Teams.  These Care Teams include your primary Cardiologist (physician) and Advanced Practice Providers (APPs -  Physician Assistants and Nurse Practitioners) who all work together to provide you with the care you need, when you need it.  We recommend signing up for the patient portal called "MyChart".  Sign up information is provided on this After Visit Summary.  MyChart is used to connect with patients for Virtual Visits (Telemedicine).  Patients are able to view lab/test results, encounter notes, upcoming appointments, etc.  Non-urgent messages can be sent to your provider as well.   To learn more about what you can do with MyChart, go to [email or web address].    Your next appointment:   Call us if you need us!    Important Information About Sugar          I,Mathew Stumpf,acting as a scribe for Genu[employer] PartsBridgette Thessaly Mccullers, MD.,have documented all relevant documentation on the behalf of Jodelle RedBridgette Akela Pocius, MD,as directed by  Jodelle RedBridgette Lizandro Spellman, MD while in the presence of Jodelle RedBridgette Kamil Hanigan, MD.  I, Jodelle RedBridgette Alexandros Ewan, MD, have reviewed all documentation for this visit. The documentation on 04/27/22 for the exam, diagnosis, procedures, and orders are all accurate and complete.    Signed, Jodelle RedBridgette Haylie Mccutcheon, MD PhD 03/26/2022     [facility]

## 2022-04-08 ENCOUNTER — Other Ambulatory Visit (HOSPITAL_BASED_OUTPATIENT_CLINIC_OR_DEPARTMENT_OTHER): Payer: Managed Care, Other (non HMO)

## 2022-04-20 ENCOUNTER — Ambulatory Visit (INDEPENDENT_AMBULATORY_CARE_PROVIDER_SITE_OTHER): Payer: Managed Care, Other (non HMO)

## 2022-04-20 DIAGNOSIS — R079 Chest pain, unspecified: Secondary | ICD-10-CM | POA: Diagnosis not present

## 2022-04-20 LAB — ECHOCARDIOGRAM COMPLETE
AR max vel: 2.01 cm2
AV Area VTI: 2.02 cm2
AV Area mean vel: 2.24 cm2
AV Mean grad: 3 mmHg
AV Peak grad: 7.6 mmHg
Ao pk vel: 1.38 m/s
Area-P 1/2: 3.85 cm2
Calc EF: 63.5 %
S' Lateral: 3.03 cm
Single Plane A2C EF: 63.7 %
Single Plane A4C EF: 62 %

## 2022-05-01 ENCOUNTER — Encounter (HOSPITAL_BASED_OUTPATIENT_CLINIC_OR_DEPARTMENT_OTHER): Payer: Self-pay

## 2022-12-02 LAB — LAB REPORT - SCANNED
A1c: 5.2
EGFR: 112

## 2023-11-02 ENCOUNTER — Other Ambulatory Visit: Payer: Self-pay | Admitting: Obstetrics and Gynecology

## 2023-11-02 ENCOUNTER — Other Ambulatory Visit (HOSPITAL_COMMUNITY)
Admission: RE | Admit: 2023-11-02 | Discharge: 2023-11-02 | Disposition: A | Discharge status: Home / Self Care | Payer: Managed Care, Other (non HMO) | Source: Ambulatory Visit | Attending: Obstetrics and Gynecology | Admitting: Obstetrics and Gynecology

## 2023-11-02 DIAGNOSIS — Z01419 Encounter for gynecological examination (general) (routine) without abnormal findings: Secondary | ICD-10-CM | POA: Insufficient documentation

## 2023-11-05 LAB — CYTOLOGY - PAP
Comment: NEGATIVE
Diagnosis: NEGATIVE
High risk HPV: NEGATIVE

## 2024-01-01 LAB — LAB REPORT - SCANNED
A1c: 5
EGFR: 108

## 2024-01-03 ENCOUNTER — Ambulatory Visit: Attending: Nurse Practitioner | Admitting: Nurse Practitioner

## 2024-01-03 ENCOUNTER — Encounter: Payer: Self-pay | Admitting: Nurse Practitioner

## 2024-01-03 ENCOUNTER — Other Ambulatory Visit: Payer: Self-pay | Admitting: *Deleted

## 2024-01-03 VITALS — BP 122/92 | HR 89 | Ht 65.0 in | Wt 129.8 lb

## 2024-01-03 DIAGNOSIS — E7800 Pure hypercholesterolemia, unspecified: Secondary | ICD-10-CM

## 2024-01-03 DIAGNOSIS — Z7189 Other specified counseling: Secondary | ICD-10-CM | POA: Diagnosis not present

## 2024-01-03 DIAGNOSIS — I10 Essential (primary) hypertension: Secondary | ICD-10-CM

## 2024-01-03 DIAGNOSIS — R079 Chest pain, unspecified: Secondary | ICD-10-CM

## 2024-01-03 MED ORDER — AMLODIPINE BESYLATE 2.5 MG PO TABS: 2.5000 mg | ORAL_TABLET | Freq: Every day | ORAL | 3 refills | Status: DC

## 2024-01-03 MED ORDER — ROSUVASTATIN CALCIUM 10 MG PO TABS: 10.0000 mg | ORAL_TABLET | Freq: Every day | ORAL | 3 refills | Status: DC

## 2024-01-03 NOTE — Progress Notes (Signed)
 Cardiology Office Note:  .   Date:  01/03/2024  ID:  Emily Richards, DOB Mar 07, 1986, MRN [id number] PCP: Rometta Emery, MD  [facility] Providers Cardiologist:  Jodelle Red, MD    Patient Profile: .      PMH Hyperlipidemia GERD Chest pain Hyperlipidemia  Referred to cardiology and seen by Dr. Cristal Deer 03/26/2022 for evaluation of chest tightness.  At her office visit with PCP, Dr. Mikeal Hawthorne, on 03/23/2022 she complained of central chest pressure onset 1 week prior.  She described discomfort as chest pressure that progresses to strange pinprick sensations throughout her upper chest and back.  Episodes occurring daily, often when she is laying in bed before falling asleep and typically lasting 5 to 10 minutes before resolving.  She feels improvement when laying on her side and deep breathing has helped as well.  She endorsed intermittent LE edema that she was managing with compression socks.  No family history of premature CAD.  EKG revealed NSR at 92 bpm with nonspecific T wave pattern.  She was advised to follow healthy lifestyle and return as needed. Echocardiogram was completed 04/20/2022 and revealed normal LVEF, normal diastolic parameters, normal RV, no significant valve disease, providing additional reassurance that CP not cardiac in nature.        History of Present Illness: .   Emily Richards is a very pleasant 38 y.o. female who is here today for concerns about elevated cholesterol readings.  She reports recent lab work with PCP which revealed total cholesterol 281, HDL 60, triglycerides 59, and LDL 205.  She also has copies of lipid panel from 11/2022 which revealed total cholesterol 229, HDL 54, triglycerides 117, and LDL 152.  At that time, she wanted to work on better diet and increasing exercise prior to starting medication, but admits now she is concerned.  Family history significant for her mother who has high cholesterol but no previous stroke or heart  attack.  Her sister has high blood pressure, and paternal grandmother has heart problems but she is unsure what type.  She is active as a mom of twin boys who are 44 and an 49-year-old daughter.  She works at [employer] but work is mostly sedentary. Currently fasting due to Ramadan. Typically follows a healthy diet cooking in olive oil, and eating lots of fish and vegetables.  Admits she does often eat [employer] sandwiches.  She does not exercise on a consistent basis.  She denies chest pain, palpitations, shortness of breath, edema, orthopnea, PND, presyncope, or syncope.    Discussed the use of AI scribe software for clinical note transcription with the patient, who gave verbal consent to proceed.   ROS: See HPI       Studies Reviewed: Marland Kitchen   EKG Interpretation Date/Time:  Monday January 03 2024 13:57:12 EDT Ventricular Rate:  89 PR Interval:  154 QRS Duration:  80 QT Interval:  356 QTC Calculation: 433 R Axis:   71  Text Interpretation: Normal sinus rhythm Nonspecific T wave abnormality No acute changes Confirmed by Eligha Bridegroom [phone]) on 01/03/2024 2:04:11 PM    No results found for: "LIPOA"   Risk Assessment/Calculations:     HYPERTENSION CONTROL Vitals:   01/03/24 1353 01/03/24 1422  BP: (!) 130/90 (!) 122/92    The patient's blood pressure is elevated above target today.  In order to address the patient's elevated BP: A new medication was prescribed today.          Physical Exam:  VS:  BP (!) 122/92   Pulse 89   Ht 5\' 5"  (1.651 m)   Wt 129 lb 12.8 oz (58.9 kg)   SpO2 99%   BMI 21.60 kg/m    Wt Readings from Last 3 Encounters:  01/03/24 129 lb 12.8 oz (58.9 kg)  03/26/22 133 lb (60.3 kg)  12/18/18 127 lb 0.8 oz (57.6 kg)    GEN: Well nourished, well developed in no acute distress NECK: No JVD; No carotid bruits CARDIAC: RRR, no murmurs, rubs, gallops RESPIRATORY:  Clear to auscultation without rales, wheezing or rhonchi  ABDOMEN: Soft, non-tender,  non-distended EXTREMITIES:  No edema; No deformity     ASSESSMENT AND PLAN: .    Hyperlipidemia: LDL is significantly elevated at 205.  Lipid panel from 2024 revealed LDL of 152.  We discussed findings possibly consistent with familial hyperlipidemia.  Her mother has high cholesterol but no significant family history of early CAD.  She wants to aggressively lower her cholesterol.  Diet is overall pretty healthy. She plans to eliminate fast food chicken sandwiches. Will have her start simvastatin 10 mg daily.  Will have repeat lipid testing and ALT in 2 to 3 months along with LP(a).  Advised current LDL goal is < 100 or 50% reduction but if LP(a) is elevated, will aim for LDL < 70.  She is in agreement with plan.  Heart healthy diet avoiding saturated fat, processed food, simple carbohydrates, and sugar encouraged. Aim for 150 minutes of moderate intensity exercise each week. We discussed practical ways for her to increase exercise.   Hypertension: BP initially elevated and remains elevated on my recheck.  She is on spironolactone 100 mg daily for acne.  Advised BP would likely be higher if not on this medication.  We will add amlodipine 2.5 mg daily. Low sodium diet recommended. I will see her back for soon follow-up.   Cardiac risk counseling: Lengthy discussion about ASCVD risk.  Her risk score currently would be low, however we need to address significantly elevated LDL to reduce future risk. Focus on lifestyle modification including heart healthy mostly plant based diet avoiding saturated fat, processed foods, simple carbohydrates, and sugar along with aiming for at least 150 minutes of moderate intensity exercise each week. Starting rosuvastatin and amlodipine as noted above.         Disposition:2-3 months with me  Signed, Eligha Bridegroom, NP-C

## 2024-01-03 NOTE — Patient Instructions (Signed)
 Medication Instructions:   START Amlodipine one (1) tablet by mouth ( 2.5 mg) daily.   START Rosuvastatin one (1) tablet by mouth ( 10 mg) daily.   *If you need a refill on your cardiac medications before your next appointment, please call your pharmacy*   Lab Work:  Your physician recommends that you return for a FASTING NMR/ALT/LPA, a week before your June appointment fasting after midnight. Paperwork given to pt today. Refer to the [facility] listed below:        If you have labs (blood work) drawn today and your tests are completely normal, you will receive your results only by: MyChart Message (if you have MyChart) OR A paper copy in the mail If you have any lab test that is abnormal or we need to change your treatment, we will call you to review the results.   Testing/Procedures:  None ordered.   Follow-Up: At [facility], you and your health needs are our priority.  As part of our continuing mission to provide you with exceptional heart care, we have created designated Provider Care Teams.  These Care Teams include your primary Cardiologist (physician) and Advanced Practice Providers (APPs -  Physician Assistants and Nurse Practitioners) who all work together to provide you with the care you need, when you need it.  We recommend signing up for the patient portal called "MyChart".  Sign up information is provided on this After Visit Summary.  MyChart is used to connect with patients for Virtual Visits (Telemedicine).  Patients are able to view lab/test results, encounter notes, upcoming appointments, etc.  Non-urgent messages can be sent to your provider as well.   To learn more about what you can do with MyChart, go to [email or web address].    Your next appointment:   3 month(s)  Provider:   Eligha Bridegroom, NP

## 2024-03-30 ENCOUNTER — Telehealth (HOSPITAL_BASED_OUTPATIENT_CLINIC_OR_DEPARTMENT_OTHER): Payer: Self-pay | Admitting: *Deleted

## 2024-03-30 NOTE — Telephone Encounter (Signed)
 S/w pt as a friendly reminder to get fasting lab work one week prior to upcoming appt with Manuella Seller

## 2024-04-07 LAB — ALT: ALT: 17 IU/L (ref 0–32)

## 2024-04-10 ENCOUNTER — Ambulatory Visit: Payer: Self-pay | Admitting: Nurse Practitioner

## 2024-04-10 LAB — NMR, LIPOPROFILE
Cholesterol, Total: 184 mg/dL (ref 100–199)
HDL Particle Number: 32.5 umol/L (ref >=30.5)
HDL-C: 83 mg/dL (ref >39)
LDL Particle Number: 789 nmol/L (ref <1000)
LDL Size: 21.4 nm (ref >20.5)
LDL-C (NIH Calc): 93 mg/dL (ref 0–99)
LP-IR Score: <25 (ref <=45)
Small LDL Particle Number: <90 nmol/L (ref <=527)
Triglycerides: 38 mg/dL (ref 0–149)

## 2024-04-10 LAB — LIPOPROTEIN A (LPA): Lipoprotein (a): 202.1 nmol/L — ABNORMAL HIGH (ref <75.0)

## 2024-04-10 NOTE — Telephone Encounter (Signed)
 FYI

## 2024-04-13 ENCOUNTER — Encounter (HOSPITAL_BASED_OUTPATIENT_CLINIC_OR_DEPARTMENT_OTHER): Payer: Self-pay | Admitting: Nurse Practitioner

## 2024-04-13 ENCOUNTER — Ambulatory Visit (INDEPENDENT_AMBULATORY_CARE_PROVIDER_SITE_OTHER): Admitting: Nurse Practitioner

## 2024-04-13 VITALS — BP 118/72 | HR 97 | Ht 65.0 in | Wt 133.0 lb

## 2024-04-13 DIAGNOSIS — E7841 Elevated Lipoprotein(a): Secondary | ICD-10-CM

## 2024-04-13 DIAGNOSIS — I10 Essential (primary) hypertension: Secondary | ICD-10-CM

## 2024-04-13 DIAGNOSIS — R000 Tachycardia, unspecified: Secondary | ICD-10-CM

## 2024-04-13 DIAGNOSIS — Z7189 Other specified counseling: Secondary | ICD-10-CM | POA: Diagnosis not present

## 2024-04-13 DIAGNOSIS — E7800 Pure hypercholesterolemia, unspecified: Secondary | ICD-10-CM | POA: Diagnosis not present

## 2024-04-13 NOTE — Progress Notes (Signed)
 Cardiology Office Note:  .   Date:  04/13/2024  ID:  Emily Richards, DOB 1986/05/02, MRN [id number] PCP: Sim Emery CROME, MD  [facility] Providers Cardiologist:  Shelda Bruckner, MD    Patient Profile: .      PMH Hyperlipidemia GERD Chest pain Hyperlipidemia Elevated lipoprotein a  Referred to cardiology and seen by Dr. Bruckner 03/26/2022 for evaluation of chest tightness.  At her office visit with PCP, Dr. Sim, on 03/23/2022 she complained of central chest pressure onset 1 week prior.  She described discomfort as chest pressure that progresses to strange pinprick sensations throughout her upper chest and back.  Episodes occurring daily, often when she is laying in bed before falling asleep and typically lasting 5 to 10 minutes before resolving.  She feels improvement when laying on her side and deep breathing has helped as well.  She endorsed intermittent LE edema that she was managing with compression socks.  No family history of premature CAD.  EKG revealed NSR at 92 bpm with nonspecific T wave pattern.  She was advised to follow healthy lifestyle and return as needed. Echocardiogram was completed 04/20/2022 and revealed normal LVEF, normal diastolic parameters, normal RV, no significant valve disease, providing additional reassurance that CP not cardiac in nature.   Seen by me on 01/03/24 for concerns about elevated cholesterol readings.  Recent lab work with PCP revealed total cholesterol 281, HDL 60, triglycerides 59, and LDL 205.  She also has copies of lipid panel from 11/2022 which revealed total cholesterol 229, HDL 54, triglycerides 117, and LDL 152.  At that time, she wanted to work on better diet and increasing exercise prior to starting medication, but admits now she is concerned.  Family history significant for her mother who has high cholesterol but no previous stroke or heart attack, sister has high blood pressure, and paternal grandmother has heart problems  but she is unsure what type. She is active as a mom of twin boys who are 3 and an 76-year-old daughter. She works at [employer] but work is mostly sedentary. Currently fasting due to Ramadan. Typically follows a healthy diet cooking in olive oil, and eating lots of fish and vegetables.  Admits she does often eat [employer] sandwiches.  She does not exercise on a consistent basis.  She denies chest pain, palpitations, shortness of breath, edema, orthopnea, PND, presyncope, or syncope.        History of Present Illness: .   Discussed the use of AI scribe software for clinical note transcription with the patient, who gave verbal consent to proceed.  History of Present Illness Emily Richards is a very pleasant 38 year old female who is here today for follow-up of hyperlipidemia. She reports consistently elevated heart rate in the 90s to 100s bpm. She attributes to stress and a busy lifestyle. She consumes one cup of coffee daily and occasionally drinks tea, with no intake of energy drinks or soda. She has modified her diet to  reduce intake of fast food and rice, and is encouraging healthier eating habits within her family. We discussed the improvement in her lipid panel and the elevated lipoprotein a. She denies shortness of breath, chest discomfort, palpitations, lightheadedness, presyncope or syncope.  Discussed the use of AI scribe software for clinical note transcription with the patient, who gave verbal consent to proceed.   ROS: See HPI       Studies Reviewed: SABRA        Lipoprotein (a)  Date/Time  Value Ref Range Status  04/06/2024 09:27 AM 202.1 (H) <75.0 nmol/L Final    Comment:    Note:  Values greater than or equal to 75.0 nmol/L may        indicate an independent risk factor for CHD,        but must be evaluated with caution when applied        to non-Caucasian populations due to the        influence of genetic factors on Lp(a) across        ethnicities.      Risk  Assessment/Calculations:             Physical Exam:   VS:  BP 118/72 (BP Location: Left Arm, Patient Position: Sitting, Cuff Size: Normal)   Pulse 97   Ht 5' 5 (1.651 m)   Wt 133 lb (60.3 kg)   SpO2 99%   BMI 22.13 kg/m    Wt Readings from Last 3 Encounters:  04/13/24 133 lb (60.3 kg)  01/03/24 129 lb 12.8 oz (58.9 kg)  03/26/22 133 lb (60.3 kg)    GEN: Well nourished, well developed in no acute distress NECK: No JVD; No carotid bruits CARDIAC: RRR, no murmurs, rubs, gallops RESPIRATORY:  Clear to auscultation without rales, wheezing or rhonchi  ABDOMEN: Soft, non-tender, non-distended EXTREMITIES:  No edema; No deformity     ASSESSMENT AND PLAN: .    Elevated LP(a): We discussed elevated LP(a) and ASCVD risk that is associated. Current treatment options include mild reduction with PCSK9 inhibitors. Consider PCSK9 inhibitors if insurance approves, but she prefers to wait six months before deciding. We will recheck NMR lipoprofile in 6 months.   Hyperlipidemia: Lipid panel completed 04/06/2024 with LDL particle #789, LDL-C 93, HDL-C 83, triglycerides 38, and small LDL-P < 90. LDL significantly improved from last reading of  205.  Lipid panel from 2024 revealed LDL of 152. Her mother has high cholesterol but no significant family history of early CAD.  Advised current LDL goal is < 100 or 50% reduction but if LP(a) is elevated, will aim for LDL < 70.  She has improved her diet to avoid fast foods and simple carbohydrates. We discussed potentially adding PCSK9 inhibitor to further reduce LDL and get some reduction in LP(a).  She would like to continue to consider this and have repeat lipid panel in 6 months. Advised her to notify us  if she elects to start PCSK9i prior to next appointment. No s/e on rosuvastatin . Continue rosuvastatin  10 mg daily.  Hypertension: BP is well controlled. Home BP has improved with additional of amlodipine  2.5 mg daily. Continue low sodium diet recommended.  No  change in antihypertensive therapy today..   Elevated heart rate: She reports consistent heart rate elevations in the 90s to low 100s.  No episodes of HR > 120 bpm. She is asymptomatic. Recommended continued monitoring at home and to gradually increase exercise to potentially lower HR. Consider low-dose beta-blocker if HR elevations consistently > 110 bpm.   Cardiac risk counseling: Lengthy discussion about ASCVD risk.  Her risk score currently would be low however she has elevated LP(a). Management options discussed as noted above. She will consider PCSK9i in the future and continue to focus on lifestyle modification including heart healthy mostly plant based diet avoiding saturated fat, processed foods, simple carbohydrates, and sugar along with aiming for at least 150 minutes of moderate intensity exercise each week.         Disposition: 6 months  with Dr. Lonni or APP  Signed, [name], NP-C

## 2024-04-13 NOTE — Patient Instructions (Signed)
 Medication Instructions:   Your physician recommends that you continue on your current medications as directed. Please refer to the Current Medication list given to you today.   *If you need a refill on your cardiac medications before your next appointment, please call your pharmacy*  Lab Work:  Your physician recommends that you return for a FASTING NMR, fasting after midnight.  One week prior to your appointment in December with Dr. Lonni. Paperwork given to patient today.    If you have labs (blood work) drawn today and your tests are completely normal, you will receive your results only by: MyChart Message (if you have MyChart) OR A paper copy in the mail If you have any lab test that is abnormal or we need to change your treatment, we will call you to review the results.  Testing/Procedures:  None ordered.  Follow-Up: At [facility], you and your health needs are our priority.  As part of our continuing mission to provide you with exceptional heart care, our providers are all part of one team.  This team includes your primary Cardiologist (physician) and Advanced Practice Providers or APPs (Physician Assistants and Nurse Practitioners) who all work together to provide you with the care you need, when you need it.  Your next appointment:   6 month(s)  Provider:   Shelda Lonni, MD    We recommend signing up for the patient portal called MyChart.  Sign up information is provided on this After Visit Summary.  MyChart is used to connect with patients for Virtual Visits (Telemedicine).  Patients are able to view lab/test results, encounter notes, upcoming appointments, etc.  Non-urgent messages can be sent to your provider as well.   To learn more about what you can do with MyChart, go to [email or web address].   Other Instructions  Adopting a Healthy Lifestyle.   Weight: Know what a healthy weight is for you (roughly BMI <25) and aim to maintain this.  You can calculate your body mass index on your smart phone. Unfortunately, this is not the most accurate measure of healthy weight, but it is the simplest measurement to use. A more accurate measurement involves body scanning which measures lean muscle, fat tissue and bony density. We do not have this equipment at [facility].    Diet: Aim for 7+ servings of fruits and vegetables daily Limit animal fats in diet for cholesterol and heart health - choose grass fed whenever available Avoid highly processed foods (fast food burgers, tacos, fried chicken, pizza, hot dogs, french fries)  Saturated fat comes in the form of butter, lard, coconut oil, margarine, partially hydrogenated oils, and fat in meat. These increase your risk of cardiovascular disease.  Use healthy plant oils, such as olive, canola, soy, corn, sunflower and peanut.  Whole foods such as fruits, vegetables and whole grains have fiber  Men need > 38 grams of fiber per day Women need > 25 grams of fiber per day  Load up on vegetables and fruits - one-half of your plate: Aim for color and variety, and remember that potatoes dont count. Go for whole grains - one-quarter of your plate: Whole wheat, barley, wheat berries, quinoa, oats, brown rice, and foods made with them. If you want pasta, go with whole wheat pasta. Protein power - one-quarter of your plate: Fish, chicken, beans, and nuts are all healthy, versatile protein sources. Limit red meat. You need carbohydrates for energy! The type of carbohydrate is more important than the amount. Choose carbohydrates such  as vegetables, fruits, whole grains, beans, and nuts in the place of white rice, white pasta, potatoes (baked or fried), macaroni and cheese, cakes, cookies, and donuts.  If youre thirsty, drink water. Coffee and tea are good in moderation, but skip sugary drinks and limit milk and dairy products to one or two daily servings. Keep sugar intake at 6 teaspoons or 24 grams or LESS        Exercise: Aim for 150 min of moderate intensity exercise weekly for heart health, and weights twice weekly for bone health Stay active - any steps are better than no steps! Aim for 7-9 hours of sleep daily

## 2024-10-11 LAB — NMR, LIPOPROFILE
Cholesterol, Total: 274 mg/dL — ABNORMAL HIGH (ref 100–199)
HDL Particle Number: 32.3 umol/L
HDL-C: 75 mg/dL
LDL Particle Number: 1577 nmol/L — ABNORMAL HIGH
LDL Size: 21.9 nm
LDL-C (NIH Calc): 194 mg/dL — ABNORMAL HIGH (ref 0–99)
LP-IR Score: <25
Small LDL Particle Number: 148 nmol/L
Triglycerides: 39 mg/dL (ref 0–149)

## 2024-10-15 ENCOUNTER — Ambulatory Visit (HOSPITAL_BASED_OUTPATIENT_CLINIC_OR_DEPARTMENT_OTHER): Payer: Self-pay | Admitting: Nurse Practitioner

## 2024-10-16 NOTE — Progress Notes (Signed)
 " Cardiology Office Note:  .   Date:  10/17/2024  ID:  Emily Richards, DOB 11-18-1985, MRN [id number] PCP: Sim Emery CROME, MD  [facility] Providers Cardiologist:  Shelda Bruckner, MD {  History of Present Illness: .   Emily Richards is a 38 y.o. female with a hx of hyperlipidemia, GERD, postpartum hemorrhage. I met her 03/26/2022 for the evaluation of chest pain.  Pertinent CV history: Seen in 2023 for atypical chest pain. Seen again in 3.2025 by [name] for hyperlipidemia. LDL from PCP was 205.   Today: Has been seeing her heart rate be higher, heart rate not more than 100 but more in the 90 range. Usually had been more in the 80s resting. No sensation of abnormal or racing beats. Just notices on her device. Under a lot of stress. 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 No limitations.   She notes that she has not been taking her cholesterol medication regularly. She thought that her numbers would be better, so she did not refill her statin. We reviewed her numbers today. She wants to get back to her medication. She generally eats well but notes that when she eats healthy, she loses weight, which she does not want to do, and find she slips into poor eating habits when her weight goes down or she is stressed.  Asking for a referral to primary care. She will go upstairs to see if she can get an appointment  ROS notable for difficulty sleeping.   ROS: Denies chest pain, shortness of breath at rest or with normal exertion. No PND, orthopnea, LE edema or unexpected weight gain. No syncope. ROS otherwise negative except as noted.   Studies Reviewed: SABRA    EKG:       Physical Exam:   VS:  BP 132/80   Pulse 97   Ht 5' 5 (1.651 m)   Wt 131 lb 6.4 oz (59.6 kg)   SpO2 98%   BMI 21.87 kg/m    Wt Readings from Last 3 Encounters:  10/17/24 131 lb 6.4 oz (59.6 kg)  04/13/24 133 lb (60.3 kg)  01/03/24 129 lb 12.8 oz (58.9 kg)     GEN: Well nourished, well developed in no acute distress HEENT: Normal, moist mucous membranes NECK: No JVD CARDIAC: regular rhythm, normal S1 and S2, no rubs or gallops. No murmur. VASCULAR: Radial and DP pulses 2+ bilaterally. No carotid bruits RESPIRATORY:  Clear to auscultation without rales, wheezing or rhonchi  ABDOMEN: Soft, non-tender, non-distended MUSCULOSKELETAL:  Ambulates independently SKIN: Warm and dry, no edema NEUROLOGIC:  Alert and oriented x 3. No focal neuro deficits noted. PSYCHIATRIC:  Normal affect    ASSESSMENT AND PLAN: .    Hypercholesterolemia Elevated lp(a) -started on statin, LDL decreased from 205 to 93 03/2024 -repeat lipids 09/2024 with LDL 194, HDL 75, TG 39. She notes that she was off statin at this time -lp(a) 202 -reviewed diet, exercise recommendations at length today -she will restart statin, recheck lipids in 3 mos -discussed watching for future therapies for lpa  Hypertension -just at goal today -continue spironolactone (primarily for acne) -is not taking amlodipine  2.5 mg currently. BP near goal. Will focus on lifestyle change, reassess at follow up  CV risk counseling and prevention -recommend heart healthy/Mediterranean diet, with whole grains, fruits, vegetable, fish, lean meats, nuts, and olive oil. Limit salt. -recommend moderate walking, 3-5 times/week for 30-50 minutes each session. Aim for at least 150 minutes/week.  Goal should be pace of 3 miles/hours, or walking 1.5 miles in 30 minutes -recommend avoidance of tobacco products. Avoid excess alcohol. -ASCVD risk score: The ASCVD Risk score (Arnett DK, et al., 2019) failed to calculate for the following reasons:   The 2019 ASCVD risk score is only valid for ages 10 to 59   According to ACC/AHA guidelines, patients with an LDL-C level greater than 190 mg/dL (5.08 mmol/L) should be considered for high-intensity statin therapy. This patient's most recent LDL-C level is 194 mg/dL.     Dispo: 3 mos for lipids, 6 mos in office with me or [name]  Signed, Shelda Bruckner, MD   Shelda Bruckner, MD, PhD, [facility]  [facility]  [facility] [address] [place], [place] [address] [phone]   "

## 2024-10-17 ENCOUNTER — Ambulatory Visit (INDEPENDENT_AMBULATORY_CARE_PROVIDER_SITE_OTHER): Admitting: Cardiology

## 2024-10-17 ENCOUNTER — Encounter (HOSPITAL_BASED_OUTPATIENT_CLINIC_OR_DEPARTMENT_OTHER): Payer: Self-pay | Admitting: Cardiology

## 2024-10-17 VITALS — BP 132/80 | HR 97 | Ht 65.0 in | Wt 131.4 lb

## 2024-10-17 DIAGNOSIS — E7800 Pure hypercholesterolemia, unspecified: Secondary | ICD-10-CM | POA: Diagnosis not present

## 2024-10-17 DIAGNOSIS — Z7189 Other specified counseling: Secondary | ICD-10-CM

## 2024-10-17 DIAGNOSIS — E7841 Elevated Lipoprotein(a): Secondary | ICD-10-CM | POA: Diagnosis not present

## 2024-10-17 DIAGNOSIS — I10 Essential (primary) hypertension: Secondary | ICD-10-CM

## 2024-10-17 MED ORDER — ROSUVASTATIN CALCIUM 10 MG PO TABS: 10.0000 mg | ORAL_TABLET | Freq: Every day | ORAL | 3 refills | Status: AC

## 2024-10-17 NOTE — Patient Instructions (Signed)
 Medication Instructions:  RESTART ROSUVASTATIN  DAILY   *If you need a refill on your cardiac medications before your next appointment, please call your pharmacy*  Lab Work: FASTING LIPID IN 3 MONTHS   If you have labs (blood work) drawn today and your tests are completely normal, you will receive your results only by: MyChart Message (if you have MyChart) OR A paper copy in the mail If you have any lab test that is abnormal or we need to change your treatment, we will call you to review the results.  Testing/Procedures: NONE  Follow-Up: At [facility], you and your health needs are our priority.  As part of our continuing mission to provide you with exceptional heart care, our providers are all part of one team.  This team includes your primary Cardiologist (physician) and Advanced Practice Providers or APPs (Physician Assistants and Nurse Practitioners) who all work together to provide you with the care you need, when you need it.  Your next appointment:   6 month(s)  Provider:   Shelda Bruckner, MD or [name], NP    We recommend signing up for the patient portal called MyChart.  Sign up information is provided on this After Visit Summary.  MyChart is used to connect with patients for Virtual Visits (Telemedicine).  Patients are able to view lab/test results, encounter notes, upcoming appointments, etc.  Non-urgent messages can be sent to your provider as well.   To learn more about what you can do with MyChart, go to [email or web address].
